# Patient Record
Sex: Female | Born: 1965 | Race: White | Hispanic: No | Smoking: Former smoker
Health system: Southern US, Community
[De-identification: ages and names within clinical notes are randomized; demographics above are authoritative.]

## PROBLEM LIST (undated history)

## (undated) DIAGNOSIS — Z8489 Family history of other specified conditions: Secondary | ICD-10-CM

## (undated) DIAGNOSIS — I10 Essential (primary) hypertension: Secondary | ICD-10-CM

## (undated) DIAGNOSIS — R112 Nausea with vomiting, unspecified: Secondary | ICD-10-CM

## (undated) DIAGNOSIS — K219 Gastro-esophageal reflux disease without esophagitis: Secondary | ICD-10-CM

## (undated) DIAGNOSIS — T8859XA Other complications of anesthesia, initial encounter: Secondary | ICD-10-CM

## (undated) DIAGNOSIS — T4145XA Adverse effect of unspecified anesthetic, initial encounter: Secondary | ICD-10-CM

## (undated) DIAGNOSIS — D72829 Elevated white blood cell count, unspecified: Secondary | ICD-10-CM

## (undated) DIAGNOSIS — Z9889 Other specified postprocedural states: Secondary | ICD-10-CM

## (undated) HISTORY — PX: ROTATOR CUFF REPAIR: SHX139

## (undated) HISTORY — PX: BREAST SURGERY: SHX581

---

## 2014-02-26 ENCOUNTER — Ambulatory Visit (HOSPITAL_COMMUNITY)
Admission: RE | Admit: 2014-02-26 | Discharge: 2014-02-26 | Disposition: A | Discharge status: Home / Self Care | Payer: BC Managed Care – PPO | Source: Ambulatory Visit | Attending: Orthopedic Surgery | Admitting: Orthopedic Surgery

## 2014-02-26 DIAGNOSIS — M25619 Stiffness of unspecified shoulder, not elsewhere classified: Secondary | ICD-10-CM | POA: Insufficient documentation

## 2014-02-26 DIAGNOSIS — M629 Disorder of muscle, unspecified: Secondary | ICD-10-CM | POA: Insufficient documentation

## 2014-02-26 DIAGNOSIS — M6281 Muscle weakness (generalized): Secondary | ICD-10-CM | POA: Insufficient documentation

## 2014-02-26 DIAGNOSIS — M25612 Stiffness of left shoulder, not elsewhere classified: Secondary | ICD-10-CM

## 2014-02-26 DIAGNOSIS — M6289 Other specified disorders of muscle: Secondary | ICD-10-CM

## 2014-02-26 DIAGNOSIS — IMO0001 Reserved for inherently not codable concepts without codable children: Secondary | ICD-10-CM | POA: Insufficient documentation

## 2014-02-26 DIAGNOSIS — M25519 Pain in unspecified shoulder: Secondary | ICD-10-CM | POA: Insufficient documentation

## 2014-02-26 NOTE — Evaluation (Signed)
Occupational Therapy Evaluation  Patient Details  Name: Jamie Flowers MRN: [id number] Date of Birth: 07/06/1966  Today's Date: 02/26/2014 Time: 4098-1191 OT Time Calculation (min): 35 min Eval 35'  Visit#: 1 of 16  Re-eval: 03/26/14  Assessment Diagnosis: Left Adhesive Capulitis Surgical Date:  (n/a) Prior Therapy: 6 weeks at [facility]  Authorization: Workers Comp  Authorization Time Period: n/a  Authorization Visit#:   of     Past Medical History: No past medical history on file. Past Surgical History: No past surgical history on file.  Subjective Symptoms/Limitations Symptoms: S: "I need to get back to full time work, its just taking so long." Pertinent History: This 48 yo pt is presenting to [facility] after n injury on December 3rd.  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  Pt had increased assist at work, but went onto leave on December 30th.  Pt was able to recieve appointment at [facility] on [facility] 12[facility], initating her own protocl of RICE, and sling use prior to apopintment. Pt initatiated 6 week of PT in [place], and is now transferring services to [facility] for ease of getting to appointments. MD previousl prescribed an anti-inflammatory, and pt verbalizes this does not help. Limitations: Increased pain with pushing, pulling, lifiting Patient Stated Goals: To get back to full time work, to get her left arm across her back. Pain Assessment Currently in Pain?: No/denies (if arm is hanging 4/10, if push/pull/lift 10/10)  Precautions/Restrictions  Precautions Precautions: None Restrictions Weight Bearing Restrictions: No  Balance Screening Balance Screen Has the patient fallen in the past 6 months: No  Prior Functioning  Home Living Family/patient expects to be discharged to:: Private residence Living Arrangements:  Children Available Help at Discharge: Family (son, 55 yo) Prior Function Level of Independence: Independent with basic ADLs;Independent with homemaking with ambulation Driving: Yes Vocation: Full time employment 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 she has increased pain with turning the steering wheel when driving.  She has difficulty geting comfortable to sleep and has increased pain upon waking up.  She has difficulty doing her hair, putting on her bra, and holding anything with her left hand Dominant Hand: Right Vision - History Baseline Vision:  (Will be getting bifocals)  Cognition/Observation Cognition Overall Cognitive Status: Within Functional Limits for tasks assessed Arousal/Alertness: Awake/alert Orientation Level: Oriented X4  Sensation/Coordination/Edema Sensation Light Touch: Appears Intact Additional Comments: Pt occassionally has hot tingling in left palm.  pt has sharp pain from acromion process region.  With motion of ER in shoulder abdcution, pt has falling asleep feeling in median nerve distrubution of hand. Coordination Gross Motor Movements are Fluid and Coordinated: Yes Fine Motor Movements are Fluid and Coordinated: Yes  Additional Assessments RUE Strength Grip (lbs): 77 Lateral Pinch: 16 lbs 3 Point Pinch: 19 lbs LUE AROM (degrees) LUE Overall AROM Comments: Assessed supine (and sitting), with ER/IR adducted Left Shoulder Flexion: 114 Degrees (92) Left Shoulder ABduction: 86 Degrees (80) Left Shoulder Internal Rotation: 68 Degrees (86) Left Shoulder External Rotation: 54 Degrees (52) LUE PROM (degrees) LUE Overall PROM Comments: Assess in supine,  with ER/IR adducted Left  Shoulder Flexion: 121 Degrees Left Shoulder ABduction: 86 Degrees Left Shoulder Internal Rotation: 85 Degrees Left Shoulder External Rotation: 38 Degrees LUE Strength LUE Overall Strength Comments: Assess in sitting Left Shoulder Flexion: 3-/5 Left Shoulder ABduction: 3-/5 Left Shoulder Internal Rotation: 3-/5 Left Shoulder External Rotation: 3-/5 Grip (lbs): 16 Lateral Pinch: 9 lbs 3 Point Pinch: 9 lbs Palpation Palpation: pt has max fascial restrictions in upper arm, anterior shoulder, upper trap, and scapular regions Right Hand Strength - Pinch (lbs) Lateral Pinch: 16 lbs 3 Point Pinch: 19 lbs Left Hand Strength - Pinch (lbs) Lateral Pinch: 9 lbs 3 Point Pinch: 9 lbs    Occupational Therapy Assessment and Plan OT Assessment and Plan Clinical Impression Statement: Pt is presenting for OT evaluation with dx of adhesive capsulitis of left shoulder.  Pt has increased pain, decreased PROM and AROM, increased fascial restrictions, decreased strength, and decreased LUE functional use. Pt will benefit from skilled therapeutic intervention in order to improve on the following deficits: Impaired UE functional use;Increased fascial restricitons;Decreased range of motion;Decreased activity tolerance;Pain;Decreased strength Rehab Potential: Good OT Frequency: Min 2X/week OT Duration: 8 weeks OT Treatment/Interventions: Self-care/ADL training;Therapeutic activities;Therapeutic exercise;Neuromuscular education;Patient/family education;Manual therapy;Modalities OT Plan: Pt will benefit from skilled OT services to decrease pain, increase AROM, incrase strength, decrease fascial restictions, andn imporve overall LUE functional use.    Treatment Plan: MFR and manual stretching, PROM, AAROM, AROM, proximal strengthening, scapular stabilization, e-stim/modalities PRN, HEP, general sterngthening.  Next session - review current dowel rod HEP and complete FOTO.   Goals Home  Exercise Program Pt/caregiver will Perform Home Exercise Program: For increased ROM;For increased strengthening PT Goal: Perform Home Exercise Program - Progress: Goal set today Short Term Goals Time to Complete Short Term Goals: 4 weeks Short Term Goal 1: Pt will be educated on HEP Short Term Goal 2: Pt will improve left shoulder [facility] working towards being able to play pool Short Term Goal 3: Pt will improve left shoulder strength to atleast 3+/5 to impove her ability to walk her dogs. Short Term Goal 4: Pt will imporve left grip strength by atleast 15 lbs in order to imporve ability to walk her dogs. Short Term Goal 5: Pt will decreased left fascial restrictions to moderate to imporve her pain levels. Additional Short Term Goals?: Yes Short Term Goal 6: Pt will verbalize pain of less than 7/10 when leaning on her left arm. Long Term Goals Time to Complete Long Term Goals: 8 weeks Long Term Goal 1: Pt will achieve prior level of functioning in all ADL, IADL, work, and leisure activities. Long Term Goal 2: Pt will achieve AROM WFL in order to put on her bra. Long Term Goal 3: Pt will improve left shoulder strength to atleast 4+/5 in order to manage dogs at work. Long Term Goal 4: Pt will improve grip strength to atleast 50lbs in order to walk her dogs. Long Term Goal 5: Pt will have decreased fascial restrictions in her left shoulder to minimal, for decreased pain in AROM. Additional Long Term Goals?: Yes Long Term Goal 6: Pt will verbalize being able to complete one 20-minute routine of P90X with less than 3/10 pain in her left shoulder.  Problem List Patient Active Problem List   Diagnosis Date Noted  . Pain in joint, shoulder region 02/26/2014  . Decreased range of motion of left shoulder 02/26/2014  . Tight fascia 02/26/2014  . Muscle weakness (generalized) 02/26/2014    End of Session Activity Tolerance: Patient tolerated  treatment well General Behavior During  Therapy: WFL for tasks assessed/performed OT Plan of Care OT Home Exercise Plan: Discussed current HEP with pt - continuation of wall walks in flexion and abduction and dowel rod exercises.  GO   Jamie GuanMarie Rawlings, MS, OTR/L [phone]  02/26/2014, 5:35 PM  Physician Documentation Your signature is required to indicate approval of the treatment plan as stated above.  Please sign and either send electronically or make a copy of this report for your files and return this physician signed original.  Please mark one 1.__approve of plan  2. ___approve of plan with the following conditions.   ______________________________                                                          _____________________ Physician Signature                                                                                                             Date

## 2014-02-27 ENCOUNTER — Ambulatory Visit (HOSPITAL_COMMUNITY): Payer: Self-pay

## 2014-03-07 ENCOUNTER — Ambulatory Visit (HOSPITAL_COMMUNITY): Payer: Self-pay

## 2014-03-07 ENCOUNTER — Telehealth (HOSPITAL_COMMUNITY): Payer: Self-pay

## 2014-03-08 ENCOUNTER — Ambulatory Visit (HOSPITAL_COMMUNITY): Payer: Self-pay

## 2014-03-12 ENCOUNTER — Ambulatory Visit (HOSPITAL_COMMUNITY): Payer: Self-pay

## 2014-03-14 ENCOUNTER — Ambulatory Visit (HOSPITAL_COMMUNITY): Payer: Self-pay

## 2014-03-19 ENCOUNTER — Ambulatory Visit (HOSPITAL_COMMUNITY): Payer: Self-pay

## 2014-03-21 ENCOUNTER — Ambulatory Visit (HOSPITAL_COMMUNITY): Payer: Self-pay

## 2014-04-02 NOTE — Addendum Note (Signed)
Encounter addended by: Jeanene Erb, OT on: 04/02/2014 10:50 AM<BR>     Documentation filed: Episodes, Notes Section, Letters, Follow-up Section

## 2014-04-02 NOTE — Progress Notes (Signed)
Discharge Note.   Patient Details  Name: Jamie Flowers MRN: [id number] Date of Birth: 11-10-1965  Today's Date: 04/02/2014 The above patient was discharged from OT on 04/02/2014 secondary to patient was not approved by insurance to attend therapy.  Limmie Patricia, OTR/L,CBIS   04/02/2014, 10:49 AM

## 2014-04-04 ENCOUNTER — Other Ambulatory Visit (HOSPITAL_COMMUNITY): Payer: Self-pay | Admitting: Family Medicine

## 2014-04-04 DIAGNOSIS — Z139 Encounter for screening, unspecified: Secondary | ICD-10-CM

## 2014-04-15 ENCOUNTER — Ambulatory Visit (HOSPITAL_COMMUNITY): Payer: Self-pay

## 2014-04-18 ENCOUNTER — Ambulatory Visit (HOSPITAL_COMMUNITY): Payer: Self-pay

## 2014-05-09 ENCOUNTER — Ambulatory Visit (HOSPITAL_COMMUNITY)
Admission: RE | Admit: 2014-05-09 | Discharge: 2014-05-09 | Disposition: A | Discharge status: Home / Self Care | Payer: BC Managed Care – PPO | Source: Ambulatory Visit | Attending: Family Medicine | Admitting: Family Medicine

## 2014-05-09 ENCOUNTER — Other Ambulatory Visit (HOSPITAL_COMMUNITY): Payer: Self-pay | Admitting: Family Medicine

## 2014-05-09 DIAGNOSIS — R05 Cough: Secondary | ICD-10-CM

## 2014-05-09 DIAGNOSIS — R059 Cough, unspecified: Secondary | ICD-10-CM | POA: Insufficient documentation

## 2014-05-09 DIAGNOSIS — R0602 Shortness of breath: Secondary | ICD-10-CM | POA: Insufficient documentation

## 2014-05-13 ENCOUNTER — Ambulatory Visit (HOSPITAL_COMMUNITY): Payer: BC Managed Care – PPO

## 2014-05-22 ENCOUNTER — Other Ambulatory Visit (HOSPITAL_COMMUNITY): Payer: Self-pay | Admitting: Oncology

## 2014-05-22 DIAGNOSIS — F17200 Nicotine dependence, unspecified, uncomplicated: Secondary | ICD-10-CM

## 2014-05-22 DIAGNOSIS — D72829 Elevated white blood cell count, unspecified: Secondary | ICD-10-CM

## 2014-06-05 ENCOUNTER — Ambulatory Visit (HOSPITAL_COMMUNITY): Payer: BC Managed Care – PPO

## 2014-06-10 ENCOUNTER — Encounter (HOSPITAL_COMMUNITY): Payer: Self-pay | Admitting: Emergency Medicine

## 2014-06-10 ENCOUNTER — Other Ambulatory Visit (HOSPITAL_COMMUNITY): Payer: Self-pay | Admitting: Emergency Medicine

## 2014-06-10 ENCOUNTER — Emergency Department (HOSPITAL_COMMUNITY)
Admission: EM | Admit: 2014-06-10 | Discharge: 2014-06-10 | Disposition: A | Discharge status: Home / Self Care | Payer: BC Managed Care – PPO | Attending: Emergency Medicine | Admitting: Emergency Medicine

## 2014-06-10 ENCOUNTER — Ambulatory Visit (HOSPITAL_COMMUNITY)
Admit: 2014-06-10 | Discharge: 2014-06-10 | Disposition: A | Discharge status: Home / Self Care | Payer: BC Managed Care – PPO | Source: Ambulatory Visit | Attending: Emergency Medicine | Admitting: Emergency Medicine

## 2014-06-10 DIAGNOSIS — R110 Nausea: Secondary | ICD-10-CM | POA: Insufficient documentation

## 2014-06-10 DIAGNOSIS — Z79899 Other long term (current) drug therapy: Secondary | ICD-10-CM | POA: Insufficient documentation

## 2014-06-10 DIAGNOSIS — Z3202 Encounter for pregnancy test, result negative: Secondary | ICD-10-CM | POA: Insufficient documentation

## 2014-06-10 DIAGNOSIS — F17200 Nicotine dependence, unspecified, uncomplicated: Secondary | ICD-10-CM | POA: Insufficient documentation

## 2014-06-10 DIAGNOSIS — R1011 Right upper quadrant pain: Secondary | ICD-10-CM

## 2014-06-10 DIAGNOSIS — I10 Essential (primary) hypertension: Secondary | ICD-10-CM | POA: Insufficient documentation

## 2014-06-10 HISTORY — DX: Essential (primary) hypertension: I10

## 2014-06-10 LAB — URINALYSIS, ROUTINE W REFLEX MICROSCOPIC
Bilirubin Urine: NEGATIVE
Glucose, UA: NEGATIVE mg/dL
Ketones, ur: NEGATIVE mg/dL
Nitrite: NEGATIVE
PROTEIN: NEGATIVE mg/dL
Specific Gravity, Urine: 1.015 (ref 1.005–1.030)
UROBILINOGEN UA: 0.2 mg/dL (ref 0.0–1.0)
pH: 7 (ref 5.0–8.0)

## 2014-06-10 LAB — COMPREHENSIVE METABOLIC PANEL
ALK PHOS: 85 U/L (ref 39–117)
ALT: 20 U/L (ref 0–35)
ANION GAP: 9 (ref 5–15)
AST: 14 U/L (ref 0–37)
Albumin: 3.4 g/dL — ABNORMAL LOW (ref 3.5–5.2)
BILIRUBIN TOTAL: 0.2 mg/dL — AB (ref 0.3–1.2)
BUN: 14 mg/dL (ref 6–23)
CHLORIDE: 102 meq/L (ref 96–112)
CO2: 25 mEq/L (ref 19–32)
Calcium: 9 mg/dL (ref 8.4–10.5)
Creatinine, Ser: 0.81 mg/dL (ref 0.50–1.10)
GFR calc non Af Amer: 86 mL/min — ABNORMAL LOW (ref >90)
GLUCOSE: 125 mg/dL — AB (ref 70–99)
POTASSIUM: 3.8 meq/L (ref 3.7–5.3)
Sodium: 136 mEq/L — ABNORMAL LOW (ref 137–147)
Total Protein: 7 g/dL (ref 6.0–8.3)

## 2014-06-10 LAB — CBC WITH DIFFERENTIAL/PLATELET
BASOS PCT: 0 % (ref 0–1)
Basophils Absolute: 0.1 10*3/uL (ref 0.0–0.1)
Eosinophils Absolute: 0.3 10*3/uL (ref 0.0–0.7)
Eosinophils Relative: 2 % (ref 0–5)
HCT: 42.8 % (ref 36.0–46.0)
Hemoglobin: 14.9 g/dL (ref 12.0–15.0)
Lymphocytes Relative: 32 % (ref 12–46)
Lymphs Abs: 4.3 10*3/uL — ABNORMAL HIGH (ref 0.7–4.0)
MCH: 30.5 pg (ref 26.0–34.0)
MCHC: 34.8 g/dL (ref 30.0–36.0)
MCV: 87.5 fL (ref 78.0–100.0)
MONO ABS: 0.9 10*3/uL (ref 0.1–1.0)
Monocytes Relative: 7 % (ref 3–12)
NEUTROS ABS: 7.9 10*3/uL — AB (ref 1.7–7.7)
NEUTROS PCT: 59 % (ref 43–77)
Platelets: 304 10*3/uL (ref 150–400)
RBC: 4.89 MIL/uL (ref 3.87–5.11)
RDW: 13.8 % (ref 11.5–15.5)
WBC: 13.5 10*3/uL — ABNORMAL HIGH (ref 4.0–10.5)

## 2014-06-10 LAB — LIPASE, BLOOD: Lipase: 42 U/L (ref 11–59)

## 2014-06-10 LAB — I-STAT CG4 LACTIC ACID, ED: Lactic Acid, Venous: 1.36 mmol/L (ref 0.5–2.2)

## 2014-06-10 LAB — URINE MICROSCOPIC-ADD ON

## 2014-06-10 LAB — POC URINE PREG, ED: PREG TEST UR: NEGATIVE

## 2014-06-10 MED ORDER — MORPHINE SULFATE 4 MG/ML IJ SOLN
4.0000 mg | Freq: Once | INTRAMUSCULAR | Status: AC
  Administered 2014-06-10: 4 mg via INTRAVENOUS
  Filled 2014-06-10: qty 1

## 2014-06-10 MED ORDER — ONDANSETRON HCL 4 MG/2ML IJ SOLN
4.0000 mg | Freq: Once | INTRAMUSCULAR | Status: AC
  Administered 2014-06-10: 4 mg via INTRAVENOUS
  Filled 2014-06-10: qty 2

## 2014-06-10 MED ORDER — PANTOPRAZOLE SODIUM 40 MG IV SOLR
40.0000 mg | Freq: Once | INTRAVENOUS | Status: AC
  Administered 2014-06-10: 40 mg via INTRAVENOUS
  Filled 2014-06-10: qty 40

## 2014-06-10 MED ORDER — OXYCODONE-ACETAMINOPHEN 5-325 MG PO TABS
2.0000 | ORAL_TABLET | Freq: Once | ORAL | Status: AC
  Administered 2014-06-10: 2 via ORAL
  Filled 2014-06-10: qty 2

## 2014-06-10 MED ORDER — HYDROMORPHONE HCL PF 1 MG/ML IJ SOLN
1.0000 mg | Freq: Once | INTRAMUSCULAR | Status: AC
  Administered 2014-06-10: 1 mg via INTRAVENOUS
  Filled 2014-06-10: qty 1

## 2014-06-10 NOTE — Discharge Instructions (Signed)

## 2014-06-10 NOTE — ED Notes (Signed)
Patient complaining of upper right abdominal pain x 2 days. Also complaining of nausea and abdominal bloating.

## 2014-06-10 NOTE — ED Notes (Signed)
Discharge instructions given and reviewed with patient.  Patient verbalized understanding to return at 10am for outpatient ultrasound.  Patient ambulatory; discharged home in good condition.

## 2014-06-10 NOTE — ED Provider Notes (Signed)
CSN: [id number]     Arrival date & time 06/10/14  0148 History   First MD Initiated Contact with Patient 06/10/14 0201     Chief Complaint  Patient presents with  . Abdominal Pain      Patient is a 48 y.o. female presenting with abdominal pain. The history is provided by the patient.  Abdominal Pain Pain location:  RUQ Pain quality: burning   Pain radiates to:  Does not radiate Pain severity:  Severe Onset quality:  Sudden Duration:  24 hours Timing:  Constant Progression:  Worsening Chronicity:  New Relieved by:  Nothing Worsened by:  Palpation Associated symptoms: nausea   Associated symptoms: no chest pain, no constipation, no diarrhea, no dysuria, no fever, no hematemesis, no hematochezia, no shortness of breath, no vaginal bleeding, no vaginal discharge and no vomiting   PT reports over 24 hrs of RUQ pain She has never had this before It started at rest No lower abd tenderness   She reports recent oncology workup due to elevated WBC count.  She is scheduled to have CT imaging of her body but this has not been performed as of yet  Past Medical History  Diagnosis Date  . Hypertension    Past Surgical History  Procedure Laterality Date  . Rotator cuff repair     History reviewed. No pertinent family history. History  Substance Use Topics  . Smoking status: Current Every Day Smoker  . Smokeless tobacco: Not on file  . Alcohol Use: No   OB History   Grav Para Term Preterm Abortions TAB SAB Ect Mult Living                 Review of Systems  Constitutional: Negative for fever.  Respiratory: Negative for shortness of breath.   Cardiovascular: Negative for chest pain.  Gastrointestinal: Positive for nausea and abdominal pain. Negative for vomiting, diarrhea, constipation, hematochezia and hematemesis.  Genitourinary: Negative for dysuria, vaginal bleeding and vaginal discharge.  All other systems reviewed and are negative.     Allergies  Rubbing  alcohol  Home Medications   Prior to Admission medications   Medication Sig Start Date End Date Taking? Authorizing Provider  lisinopril (PRINIVIL,ZESTRIL) 20 MG tablet Take 10 mg by mouth daily.   Yes Historical Provider, MD   BP 128/72  Pulse 85  Temp(Src) 98.1 F (36.7 C) (Oral)  Resp 16  Ht 5\' 2"  (1.575 m)  Wt 160 lb (72.576 kg)  BMI 29.26 kg/m2  SpO2 100%  LMP 05/14/2014 Physical Exam CONSTITUTIONAL: Well developed/well nourished HEAD: Normocephalic/atraumatic EYES: EOMI/PERRL, no icterus ENMT: Mucous membranes moist NECK: supple no meningeal signs SPINE:entire spine nontender CV: S1/S2 noted, no murmurs/rubs/gallops noted LUNGS: Lungs are clear to auscultation bilaterally, no apparent distress ABDOMEN: soft, moderate RUQ tenderness, no rebound or guarding GU:no cva tenderness NEURO: Pt is awake/alert, moves all extremitiesx4 EXTREMITIES: pulses normal, full ROM SKIN: warm, color normal PSYCH: no abnormalities of mood noted  ED Course  Procedures  2:20 AM Pt with RUQ tenderness for 24 hours Will treat pain, check labs and reasses 4:41 AM Pt improved She reports continued abd burning, but severe pain is resolved Her abdomen is soft without any other focal tenderness in RLQ She has no signs of surgical abdomen She is not toxic appearing She will return for close f/u ultrasound due to RUQ pain to evaluate for biliary colic Pt is reasonable and appears appropriate for outpatient followup We discussed strict return precautions BP 103/71  Pulse  61  Temp(Src) 98.1 F (36.7 C) (Oral)  Resp 12  Ht 5\' 2"  (1.575 m)  Wt 160 lb (72.576 kg)  BMI 29.26 kg/m2  SpO2 100%  LMP 05/14/2014  Labs Review Labs Reviewed  COMPREHENSIVE METABOLIC PANEL - Abnormal; Notable for the following:    Sodium 136 (*)    Glucose, Bld 125 (*)    Albumin 3.4 (*)    Total Bilirubin 0.2 (*)    GFR calc non Af Amer 86 (*)    All other components within normal limits  CBC WITH  DIFFERENTIAL - Abnormal; Notable for the following:    WBC 13.5 (*)    Neutro Abs 7.9 (*)    Lymphs Abs 4.3 (*)    All other components within normal limits  URINALYSIS, ROUTINE W REFLEX MICROSCOPIC - Abnormal; Notable for the following:    Hgb urine dipstick TRACE (*)    Leukocytes, UA TRACE (*)    All other components within normal limits  URINE MICROSCOPIC-ADD ON - Abnormal; Notable for the following:    Squamous Epithelial / LPF FEW (*)    Bacteria, UA FEW (*)    All other components within normal limits  LIPASE, BLOOD  POC URINE PREG, ED  I-STAT CG4 LACTIC ACID, ED      EKG Interpretation   Date/Time:  Monday June 10 2014 04:01:03 EDT Ventricular Rate:  62 PR Interval:  150 QRS Duration: 84 QT Interval:  432 QTC Calculation: 439 R Axis:   59 Text Interpretation:  Sinus rhythm Normal ECG No previous ECGs available  Confirmed by Bebe ShaggyWICKLINE  MD, Dorinda HillNALD ([id number]) on 06/10/2014 4:13:15 AM      MDM   Final diagnoses:  RUQ abdominal pain    Nursing notes including past medical history and social history reviewed and considered in documentation Labs/vital reviewed and considered     Joya Gaskinsonald W Lache Dagher, MD 06/10/14 [phone]

## 2014-06-13 ENCOUNTER — Other Ambulatory Visit (HOSPITAL_COMMUNITY): Payer: Self-pay | Admitting: Family Medicine

## 2014-06-13 ENCOUNTER — Ambulatory Visit (HOSPITAL_COMMUNITY)
Admission: RE | Admit: 2014-06-13 | Discharge: 2014-06-13 | Disposition: A | Discharge status: Home / Self Care | Payer: BC Managed Care – PPO | Source: Ambulatory Visit | Attending: Family Medicine | Admitting: Family Medicine

## 2014-06-13 DIAGNOSIS — J438 Other emphysema: Secondary | ICD-10-CM | POA: Insufficient documentation

## 2014-06-13 DIAGNOSIS — K8020 Calculus of gallbladder without cholecystitis without obstruction: Secondary | ICD-10-CM | POA: Insufficient documentation

## 2014-06-13 DIAGNOSIS — R110 Nausea: Secondary | ICD-10-CM | POA: Insufficient documentation

## 2014-06-13 DIAGNOSIS — R1013 Epigastric pain: Secondary | ICD-10-CM | POA: Insufficient documentation

## 2014-06-13 DIAGNOSIS — R109 Unspecified abdominal pain: Secondary | ICD-10-CM | POA: Insufficient documentation

## 2014-06-13 DIAGNOSIS — R042 Hemoptysis: Secondary | ICD-10-CM

## 2014-06-13 MED ORDER — IOHEXOL 300 MG/ML  SOLN
100.0000 mL | Freq: Once | INTRAMUSCULAR | Status: AC | PRN
  Administered 2014-06-13: 100 mL via INTRAVENOUS

## 2014-06-14 NOTE — ED Provider Notes (Signed)
Pt called in followup to give US results She was informed of cholelithiasis and need for surgical followup She was given number for local surgeon (mark jenkins) to followup  Jamie Flowers W Uilani Sanville, MD 06/14/14 1004

## 2014-06-15 ENCOUNTER — Encounter (HOSPITAL_COMMUNITY): Payer: Self-pay | Admitting: Emergency Medicine

## 2014-06-15 ENCOUNTER — Observation Stay (HOSPITAL_COMMUNITY)
Admission: EM | Admit: 2014-06-15 | Discharge: 2014-06-17 | Disposition: A | Discharge status: Home / Self Care | Payer: BC Managed Care – PPO | Attending: General Surgery | Admitting: General Surgery

## 2014-06-15 DIAGNOSIS — Z9109 Other allergy status, other than to drugs and biological substances: Secondary | ICD-10-CM | POA: Insufficient documentation

## 2014-06-15 DIAGNOSIS — R1011 Right upper quadrant pain: Secondary | ICD-10-CM | POA: Diagnosis present

## 2014-06-15 DIAGNOSIS — K8010 Calculus of gallbladder with chronic cholecystitis without obstruction: Principal | ICD-10-CM | POA: Insufficient documentation

## 2014-06-15 DIAGNOSIS — I10 Essential (primary) hypertension: Secondary | ICD-10-CM | POA: Diagnosis not present

## 2014-06-15 DIAGNOSIS — K660 Peritoneal adhesions (postprocedural) (postinfection): Secondary | ICD-10-CM | POA: Insufficient documentation

## 2014-06-15 DIAGNOSIS — K819 Cholecystitis, unspecified: Secondary | ICD-10-CM | POA: Diagnosis present

## 2014-06-15 HISTORY — DX: Other complications of anesthesia, initial encounter: T88.59XA

## 2014-06-15 HISTORY — DX: Family history of other specified conditions: Z84.89

## 2014-06-15 HISTORY — DX: Adverse effect of unspecified anesthetic, initial encounter: T41.45XA

## 2014-06-15 HISTORY — DX: Nausea with vomiting, unspecified: R11.2

## 2014-06-15 HISTORY — DX: Other specified postprocedural states: Z98.890

## 2014-06-15 MED ORDER — ONDANSETRON HCL 4 MG/2ML IJ SOLN
4.0000 mg | Freq: Once | INTRAMUSCULAR | Status: AC
  Administered 2014-06-15: 4 mg via INTRAVENOUS
  Filled 2014-06-15: qty 2

## 2014-06-15 MED ORDER — HYDROMORPHONE HCL PF 1 MG/ML IJ SOLN
1.0000 mg | Freq: Once | INTRAMUSCULAR | Status: AC
  Administered 2014-06-15: 1 mg via INTRAVENOUS
  Filled 2014-06-15: qty 1

## 2014-06-15 NOTE — ED Notes (Signed)
Pt states she was seen here last week and told she had gallstones, states tonight the pain got worse and is intolerable, vomiting, no diarrhea

## 2014-06-16 ENCOUNTER — Emergency Department (HOSPITAL_COMMUNITY): Payer: BC Managed Care – PPO

## 2014-06-16 ENCOUNTER — Encounter (HOSPITAL_COMMUNITY): Payer: Self-pay | Admitting: Certified Registered"

## 2014-06-16 ENCOUNTER — Encounter (HOSPITAL_COMMUNITY): Payer: BC Managed Care – PPO | Admitting: Anesthesiology

## 2014-06-16 ENCOUNTER — Observation Stay (HOSPITAL_COMMUNITY): Payer: BC Managed Care – PPO | Admitting: Anesthesiology

## 2014-06-16 ENCOUNTER — Encounter (HOSPITAL_COMMUNITY)
Admission: EM | Disposition: A | Discharge status: Home / Self Care | Payer: Self-pay | Source: Home / Self Care | Attending: Emergency Medicine

## 2014-06-16 DIAGNOSIS — R1011 Right upper quadrant pain: Secondary | ICD-10-CM

## 2014-06-16 DIAGNOSIS — K819 Cholecystitis, unspecified: Secondary | ICD-10-CM | POA: Diagnosis present

## 2014-06-16 DIAGNOSIS — K8010 Calculus of gallbladder with chronic cholecystitis without obstruction: Secondary | ICD-10-CM

## 2014-06-16 DIAGNOSIS — I10 Essential (primary) hypertension: Secondary | ICD-10-CM | POA: Diagnosis not present

## 2014-06-16 DIAGNOSIS — Z9109 Other allergy status, other than to drugs and biological substances: Secondary | ICD-10-CM | POA: Diagnosis not present

## 2014-06-16 HISTORY — PX: CHOLECYSTECTOMY: SHX55

## 2014-06-16 LAB — COMPREHENSIVE METABOLIC PANEL
ALK PHOS: 88 U/L (ref 39–117)
ALT: 18 U/L (ref 0–35)
AST: 16 U/L (ref 0–37)
Albumin: 3.8 g/dL (ref 3.5–5.2)
Anion gap: 15 (ref 5–15)
BILIRUBIN TOTAL: 0.2 mg/dL — AB (ref 0.3–1.2)
BUN: 17 mg/dL (ref 6–23)
CHLORIDE: 100 meq/L (ref 96–112)
CO2: 20 meq/L (ref 19–32)
Calcium: 10.5 mg/dL (ref 8.4–10.5)
Creatinine, Ser: 0.93 mg/dL (ref 0.50–1.10)
GFR, EST AFRICAN AMERICAN: 84 mL/min — AB (ref >90)
GFR, EST NON AFRICAN AMERICAN: 73 mL/min — AB (ref >90)
GLUCOSE: 96 mg/dL (ref 70–99)
POTASSIUM: 4.1 meq/L (ref 3.7–5.3)
Sodium: 135 mEq/L — ABNORMAL LOW (ref 137–147)
Total Protein: 7.6 g/dL (ref 6.0–8.3)

## 2014-06-16 LAB — CBC WITH DIFFERENTIAL/PLATELET
Basophils Absolute: 0.1 10*3/uL (ref 0.0–0.1)
Basophils Relative: 1 % (ref 0–1)
EOS ABS: 0.2 10*3/uL (ref 0.0–0.7)
Eosinophils Relative: 2 % (ref 0–5)
HCT: 45 % (ref 36.0–46.0)
HEMOGLOBIN: 15.8 g/dL — AB (ref 12.0–15.0)
Lymphocytes Relative: 23 % (ref 12–46)
Lymphs Abs: 3.5 10*3/uL (ref 0.7–4.0)
MCH: 30.3 pg (ref 26.0–34.0)
MCHC: 35.1 g/dL (ref 30.0–36.0)
MCV: 86.2 fL (ref 78.0–100.0)
MONOS PCT: 6 % (ref 3–12)
Monocytes Absolute: 1 10*3/uL (ref 0.1–1.0)
NEUTROS ABS: 10.4 10*3/uL — AB (ref 1.7–7.7)
Neutrophils Relative %: 68 % (ref 43–77)
Platelets: 373 10*3/uL (ref 150–400)
RBC: 5.22 MIL/uL — ABNORMAL HIGH (ref 3.87–5.11)
RDW: 13.8 % (ref 11.5–15.5)
WBC: 15.2 10*3/uL — AB (ref 4.0–10.5)

## 2014-06-16 LAB — SURGICAL PCR SCREEN
MRSA, PCR: NEGATIVE
STAPHYLOCOCCUS AUREUS: NEGATIVE

## 2014-06-16 LAB — LIPASE, BLOOD: Lipase: 32 U/L (ref 11–59)

## 2014-06-16 SURGERY — LAPAROSCOPIC CHOLECYSTECTOMY WITH INTRAOPERATIVE CHOLANGIOGRAM
Anesthesia: General | Site: Abdomen

## 2014-06-16 MED ORDER — LIDOCAINE HCL (CARDIAC) 20 MG/ML IV SOLN
INTRAVENOUS | Status: AC
  Filled 2014-06-16: qty 5

## 2014-06-16 MED ORDER — PROPOFOL 10 MG/ML IV BOLUS
INTRAVENOUS | Status: AC
  Filled 2014-06-16: qty 20

## 2014-06-16 MED ORDER — HYDROMORPHONE HCL PF 1 MG/ML IJ SOLN
1.0000 mg | Freq: Once | INTRAMUSCULAR | Status: AC
  Administered 2014-06-16: 1 mg via INTRAVENOUS
  Filled 2014-06-16: qty 1

## 2014-06-16 MED ORDER — GLYCOPYRROLATE 0.2 MG/ML IJ SOLN
INTRAMUSCULAR | Status: DC | PRN
  Administered 2014-06-16: .6 mg via INTRAVENOUS

## 2014-06-16 MED ORDER — AMPHETAMINE-DEXTROAMPHETAMINE 10 MG PO TABS
20.0000 mg | ORAL_TABLET | Freq: Every day | ORAL | Status: DC
  Filled 2014-06-16: qty 2

## 2014-06-16 MED ORDER — GLYCOPYRROLATE 0.2 MG/ML IJ SOLN
INTRAMUSCULAR | Status: AC
  Filled 2014-06-16: qty 3

## 2014-06-16 MED ORDER — ROCURONIUM BROMIDE 100 MG/10ML IV SOLN
INTRAVENOUS | Status: DC | PRN
  Administered 2014-06-16: 40 mg via INTRAVENOUS

## 2014-06-16 MED ORDER — LACTATED RINGERS IV SOLN
INTRAVENOUS | Status: DC | PRN
  Administered 2014-06-16 (×2): via INTRAVENOUS

## 2014-06-16 MED ORDER — NEOSTIGMINE METHYLSULFATE 10 MG/10ML IV SOLN
INTRAVENOUS | Status: DC | PRN
  Administered 2014-06-16: 4 mg via INTRAVENOUS

## 2014-06-16 MED ORDER — HYDROMORPHONE HCL PF 1 MG/ML IJ SOLN
INTRAMUSCULAR | Status: AC
  Filled 2014-06-16: qty 2

## 2014-06-16 MED ORDER — EPHEDRINE SULFATE 50 MG/ML IJ SOLN
INTRAMUSCULAR | Status: DC | PRN
  Administered 2014-06-16: 10 mg via INTRAVENOUS

## 2014-06-16 MED ORDER — DEXTROSE 5 % IV SOLN
2.0000 g | Freq: Every day | INTRAVENOUS | Status: DC
  Administered 2014-06-16 – 2014-06-17 (×2): 2 g via INTRAVENOUS
  Filled 2014-06-16 (×2): qty 2

## 2014-06-16 MED ORDER — HYDROMORPHONE HCL PF 1 MG/ML IJ SOLN
1.0000 mg | INTRAMUSCULAR | Status: DC | PRN
  Administered 2014-06-16 – 2014-06-17 (×3): 1 mg via INTRAVENOUS
  Filled 2014-06-16 (×3): qty 1

## 2014-06-16 MED ORDER — KETOROLAC TROMETHAMINE 30 MG/ML IJ SOLN
INTRAMUSCULAR | Status: AC
Start: 2014-06-16 — End: 2014-06-17
  Filled 2014-06-16: qty 1

## 2014-06-16 MED ORDER — BUPIVACAINE-EPINEPHRINE (PF) 0.25% -1:200000 IJ SOLN
INTRAMUSCULAR | Status: AC
  Filled 2014-06-16: qty 30

## 2014-06-16 MED ORDER — 0.9 % SODIUM CHLORIDE (POUR BTL) OPTIME
TOPICAL | Status: DC | PRN
  Administered 2014-06-16: 1000 mL

## 2014-06-16 MED ORDER — ACETAMINOPHEN 325 MG PO TABS: 325.0000 mg | ORAL_TABLET | ORAL | Status: DC | PRN

## 2014-06-16 MED ORDER — FENTANYL CITRATE 0.05 MG/ML IJ SOLN
50.0000 ug | INTRAMUSCULAR | Status: DC | PRN
  Administered 2014-06-16 (×3): 50 ug via INTRAVENOUS
  Filled 2014-06-16 (×3): qty 2

## 2014-06-16 MED ORDER — MIDAZOLAM HCL 2 MG/2ML IJ SOLN
INTRAMUSCULAR | Status: AC
  Filled 2014-06-16: qty 2

## 2014-06-16 MED ORDER — ARTIFICIAL TEARS OP OINT
TOPICAL_OINTMENT | OPHTHALMIC | Status: AC
  Filled 2014-06-16: qty 3.5

## 2014-06-16 MED ORDER — PROPOFOL 10 MG/ML IV BOLUS
INTRAVENOUS | Status: DC | PRN
  Administered 2014-06-16: 120 mg via INTRAVENOUS

## 2014-06-16 MED ORDER — ONDANSETRON HCL 4 MG/2ML IJ SOLN
INTRAMUSCULAR | Status: AC
  Filled 2014-06-16: qty 2

## 2014-06-16 MED ORDER — SUCCINYLCHOLINE CHLORIDE 20 MG/ML IJ SOLN
INTRAMUSCULAR | Status: DC | PRN
  Administered 2014-06-16: 60 mg via INTRAVENOUS

## 2014-06-16 MED ORDER — HYDROMORPHONE HCL PF 1 MG/ML IJ SOLN
0.2500 mg | INTRAMUSCULAR | Status: DC | PRN
  Administered 2014-06-16 (×4): 0.5 mg via INTRAVENOUS

## 2014-06-16 MED ORDER — ACETAMINOPHEN 160 MG/5ML PO SOLN
325.0000 mg | ORAL | Status: DC | PRN
  Filled 2014-06-16: qty 20.3

## 2014-06-16 MED ORDER — FENTANYL CITRATE 0.05 MG/ML IJ SOLN
INTRAMUSCULAR | Status: DC | PRN
  Administered 2014-06-16: 100 ug via INTRAVENOUS
  Administered 2014-06-16: 50 ug via INTRAVENOUS

## 2014-06-16 MED ORDER — SODIUM CHLORIDE 0.9 % IR SOLN
Status: DC | PRN
  Administered 2014-06-16: 1000 mL

## 2014-06-16 MED ORDER — SUCCINYLCHOLINE CHLORIDE 20 MG/ML IJ SOLN
INTRAMUSCULAR | Status: AC
  Filled 2014-06-16: qty 1

## 2014-06-16 MED ORDER — LIDOCAINE HCL (CARDIAC) 20 MG/ML IV SOLN
INTRAVENOUS | Status: DC | PRN
  Administered 2014-06-16: 70 mg via INTRAVENOUS

## 2014-06-16 MED ORDER — HYDROMORPHONE HCL PF 1 MG/ML IJ SOLN
1.0000 mg | INTRAMUSCULAR | Status: DC | PRN
  Administered 2014-06-16 (×2): 1 mg via INTRAVENOUS
  Filled 2014-06-16 (×2): qty 1

## 2014-06-16 MED ORDER — ENOXAPARIN SODIUM 40 MG/0.4ML ~~LOC~~ SOLN
40.0000 mg | SUBCUTANEOUS | Status: DC
  Administered 2014-06-17: 40 mg via SUBCUTANEOUS
  Filled 2014-06-16 (×2): qty 0.4

## 2014-06-16 MED ORDER — MIDAZOLAM HCL 5 MG/5ML IJ SOLN
INTRAMUSCULAR | Status: DC | PRN
  Administered 2014-06-16: 2 mg via INTRAVENOUS

## 2014-06-16 MED ORDER — SODIUM CHLORIDE 0.9 % IV SOLN
INTRAVENOUS | Status: DC
  Administered 2014-06-16 – 2014-06-17 (×3): via INTRAVENOUS

## 2014-06-16 MED ORDER — ROCURONIUM BROMIDE 50 MG/5ML IV SOLN
INTRAVENOUS | Status: AC
  Filled 2014-06-16: qty 1

## 2014-06-16 MED ORDER — FENTANYL CITRATE 0.05 MG/ML IJ SOLN
INTRAMUSCULAR | Status: AC
  Filled 2014-06-16: qty 5

## 2014-06-16 MED ORDER — BUPIVACAINE-EPINEPHRINE 0.25% -1:200000 IJ SOLN
INTRAMUSCULAR | Status: DC | PRN
  Administered 2014-06-16: 15 mL

## 2014-06-16 MED ORDER — ONDANSETRON HCL 4 MG/2ML IJ SOLN: 4.0000 mg | Freq: Four times a day (QID) | INTRAMUSCULAR | Status: DC | PRN

## 2014-06-16 MED ORDER — OXYCODONE-ACETAMINOPHEN 5-325 MG PO TABS
1.0000 | ORAL_TABLET | ORAL | Status: DC | PRN
  Administered 2014-06-16 – 2014-06-17 (×5): 2 via ORAL
  Filled 2014-06-16 (×5): qty 2

## 2014-06-16 MED ORDER — LISINOPRIL 10 MG PO TABS
10.0000 mg | ORAL_TABLET | Freq: Every day | ORAL | Status: DC
  Filled 2014-06-16: qty 1

## 2014-06-16 MED ORDER — ONDANSETRON HCL 4 MG/2ML IJ SOLN
INTRAMUSCULAR | Status: DC | PRN
  Administered 2014-06-16: 4 mg via INTRAVENOUS

## 2014-06-16 MED ORDER — KETOROLAC TROMETHAMINE 30 MG/ML IJ SOLN
15.0000 mg | Freq: Once | INTRAMUSCULAR | Status: AC | PRN
  Administered 2014-06-16: 30 mg via INTRAVENOUS

## 2014-06-16 MED ORDER — NEOSTIGMINE METHYLSULFATE 10 MG/10ML IV SOLN
INTRAVENOUS | Status: AC
  Filled 2014-06-16: qty 1

## 2014-06-16 MED ORDER — METRONIDAZOLE IN NACL 5-0.79 MG/ML-% IV SOLN
500.0000 mg | Freq: Three times a day (TID) | INTRAVENOUS | Status: DC
  Administered 2014-06-16 – 2014-06-17 (×3): 500 mg via INTRAVENOUS
  Filled 2014-06-16 (×7): qty 100

## 2014-06-16 SURGICAL SUPPLY — 41 items
APPLIER CLIP 5 13 M/L LIGAMAX5 (MISCELLANEOUS) ×3
APPLIER CLIP ROT 10 11.4 M/L (STAPLE)
BLADE SURG ROTATE 9660 (MISCELLANEOUS) IMPLANT
CANISTER SUCTION 2500CC (MISCELLANEOUS) ×3 IMPLANT
CHLORAPREP W/TINT 26ML (MISCELLANEOUS) IMPLANT
CLIP APPLIE 5 13 M/L LIGAMAX5 (MISCELLANEOUS) ×1 IMPLANT
CLIP APPLIE ROT 10 11.4 M/L (STAPLE) IMPLANT
COVER MAYO STAND STRL (DRAPES) IMPLANT
COVER SURGICAL LIGHT HANDLE (MISCELLANEOUS) ×3 IMPLANT
DERMABOND ADVANCED (GAUZE/BANDAGES/DRESSINGS) ×2
DERMABOND ADVANCED .7 DNX12 (GAUZE/BANDAGES/DRESSINGS) ×1 IMPLANT
DRAPE C-ARM 42X72 X-RAY (DRAPES) IMPLANT
DRAPE UTILITY 15X26 W/TAPE STR (DRAPE) ×6 IMPLANT
DRSG TEGADERM 2-3/8X2-3/4 SM (GAUZE/BANDAGES/DRESSINGS) ×12 IMPLANT
DRSG TEGADERM 4X4.75 (GAUZE/BANDAGES/DRESSINGS) ×3 IMPLANT
ELECT REM PT RETURN 9FT ADLT (ELECTROSURGICAL) ×3
ELECTRODE REM PT RTRN 9FT ADLT (ELECTROSURGICAL) ×1 IMPLANT
GLOVE BIOGEL PI IND STRL 8 (GLOVE) ×1 IMPLANT
GLOVE BIOGEL PI INDICATOR 8 (GLOVE) ×2
GLOVE ECLIPSE 7.5 STRL STRAW (GLOVE) ×3 IMPLANT
GOWN STRL REUS W/ TWL LRG LVL3 (GOWN DISPOSABLE) ×3 IMPLANT
GOWN STRL REUS W/TWL LRG LVL3 (GOWN DISPOSABLE) ×6
KIT BASIN OR (CUSTOM PROCEDURE TRAY) ×3 IMPLANT
KIT ROOM TURNOVER OR (KITS) ×3 IMPLANT
NS IRRIG 1000ML POUR BTL (IV SOLUTION) ×3 IMPLANT
PAD ARMBOARD 7.5X6 YLW CONV (MISCELLANEOUS) ×3 IMPLANT
POUCH RETRIEVAL ECOSAC 10 (ENDOMECHANICALS) ×1 IMPLANT
POUCH RETRIEVAL ECOSAC 10MM (ENDOMECHANICALS) ×2
POUCH SPECIMEN RETRIEVAL 10MM (ENDOMECHANICALS) IMPLANT
SCISSORS LAP 5X35 DISP (ENDOMECHANICALS) ×3 IMPLANT
SET CHOLANGIOGRAPH 5 50 .035 (SET/KITS/TRAYS/PACK) IMPLANT
SET IRRIG TUBING LAPAROSCOPIC (IRRIGATION / IRRIGATOR) ×3 IMPLANT
SLEEVE ENDOPATH XCEL 5M (ENDOMECHANICALS) ×3 IMPLANT
SPECIMEN JAR SMALL (MISCELLANEOUS) ×3 IMPLANT
SUT MNCRL AB 4-0 PS2 18 (SUTURE) ×3 IMPLANT
TOWEL OR 17X24 6PK STRL BLUE (TOWEL DISPOSABLE) ×3 IMPLANT
TOWEL OR 17X26 10 PK STRL BLUE (TOWEL DISPOSABLE) ×3 IMPLANT
TRAY LAPAROSCOPIC (CUSTOM PROCEDURE TRAY) ×3 IMPLANT
TROCAR XCEL BLUNT TIP 100MML (ENDOMECHANICALS) ×3 IMPLANT
TROCAR XCEL NON-BLD 11X100MML (ENDOMECHANICALS) IMPLANT
TROCAR XCEL NON-BLD 5MMX100MML (ENDOMECHANICALS) ×3 IMPLANT

## 2014-06-16 NOTE — Anesthesia Preprocedure Evaluation (Addendum)
Anesthesia Evaluation  Patient identified by MRN, date of birth, ID band Patient awake    Reviewed: Allergy & Precautions, H&P , NPO status , Patient's Chart, lab work & pertinent test results  History of Anesthesia Complications Negative for: history of anesthetic complications  Airway Mallampati: II TM Distance: >3 FB Neck ROM: Full    Dental   Pulmonary neg shortness of breath, neg sleep apnea, neg COPDneg recent URI, Current Smoker,  breath sounds clear to auscultation        Cardiovascular hypertension, Pt. on medications - angina- CAD and - Past MI Rhythm:Regular     Neuro/Psych negative neurological ROS  negative psych ROS   GI/Hepatic negative GI ROS, Neg liver ROS,   Endo/Other  negative endocrine ROS  Renal/GU negative Renal ROS     Musculoskeletal   Abdominal   Peds  Hematology negative hematology ROS (+)   Anesthesia Other Findings   Reproductive/Obstetrics                          Anesthesia Physical Anesthesia Plan  ASA: II  Anesthesia Plan: General   Post-op Pain Management:    Induction: Intravenous, Rapid sequence and Cricoid pressure planned  Airway Management Planned: Oral ETT  Additional Equipment: None  Intra-op Plan:   Post-operative Plan: Extubation in OR  Informed Consent: I have reviewed the patients History and Physical, chart, labs and discussed the procedure including the risks, benefits and alternatives for the proposed anesthesia with the patient or authorized representative who has indicated his/her understanding and acceptance.   Dental advisory given  Plan Discussed with: CRNA and Surgeon  Anesthesia Plan Comments:        Anesthesia Quick Evaluation

## 2014-06-16 NOTE — ED Provider Notes (Signed)
CSN: [id number]     Arrival date & time 06/15/14  2306 History   First MD Initiated Contact with Patient 06/15/14 2338     Chief Complaint  Patient presents with  . Abdominal Pain     (Consider location/radiation/quality/duration/timing/severity/associated sxs/prior Treatment) HPI Comments: Patient is a 48 year old female who presents to the emergency department with complaint of abdominal pain, with nausea. The patient states that on August 1 she began having problems with increasing right upper quadrant and epigastric area pain, accompanied by nausea and occasional vomiting. Prior to this the patient was seen by her primary physician and an ultrasound was ordered, the patient did not get the results of the ultrasound so she came to the emergency department because of increasing pain. The patient was noted to have gallstones. The patient was placed on antibiotics. Patient was also referred to the Gen. surgery consult. The patient states her pain has never completely gone away but is getting progressively worse. The patient to Percocet tablets before coming to the emergency department and states that it" would not touch the pain". The patient has not had any high fever that she is aware of. She's not had any injury or trauma to the abdomen. His been no blood in the vomitus or stools. There's been no blood in urine. Pt had CT scan of abd/pelvis , this revealed gallstones, but no evidence of cholecystitis he or other and abdomen pelvis problems.  Patient is a 48 y.o. female presenting with abdominal pain. The history is provided by the patient.  Abdominal Pain Associated symptoms: nausea and vomiting   Associated symptoms: no chest pain, no cough, no dysuria, no hematuria and no shortness of breath     Past Medical History  Diagnosis Date  . Hypertension    Past Surgical History  Procedure Laterality Date  . Rotator cuff repair     No family history on file. History  Substance Use Topics  .  Smoking status: Current Every Day Smoker  . Smokeless tobacco: Not on file  . Alcohol Use: No   OB History   Grav Para Term Preterm Abortions TAB SAB Ect Mult Living                 Review of Systems  Constitutional: Negative for activity change.       All ROS Neg except as noted in HPI  HENT: Negative for nosebleeds.   Eyes: Negative for photophobia and discharge.  Respiratory: Negative for cough, shortness of breath and wheezing.   Cardiovascular: Negative for chest pain and palpitations.  Gastrointestinal: Positive for nausea, vomiting and abdominal pain. Negative for blood in stool.  Genitourinary: Negative for dysuria, frequency and hematuria.  Musculoskeletal: Negative for arthralgias, back pain and neck pain.  Skin: Negative.   Neurological: Negative for dizziness, seizures and speech difficulty.  Psychiatric/Behavioral: Negative for hallucinations and confusion.      Allergies  Rubbing alcohol  Home Medications   Prior to Admission medications   Medication Sig Start Date End Date Taking? Authorizing Provider  oxyCODONE-acetaminophen (PERCOCET) 10-325 MG per tablet Take 1 tablet by mouth every 4 (four) hours as needed for pain.   Yes Historical Provider, MD  lisinopril (PRINIVIL,ZESTRIL) 20 MG tablet Take 10 mg by mouth daily.    Historical Provider, MD   BP 140/114  Pulse 86  Temp(Src) 98.7 F (37.1 C) (Oral)  Resp 22  Ht 5\' 2"  (1.575 m)  Wt 169 lb (76.658 kg)  BMI 30.90 kg/m2  SpO2  100%  LMP 05/14/2014 Physical Exam  Nursing note and vitals reviewed. Constitutional: She is oriented to person, place, and time. She appears well-developed and well-nourished.  Non-toxic appearance. She appears distressed.  HENT:  Head: Normocephalic.  Right Ear: Tympanic membrane and external ear normal.  Left Ear: Tympanic membrane and external ear normal.  Eyes: EOM and lids are normal. Pupils are equal, round, and reactive to light.  Neck: Normal range of motion. Neck  supple. Carotid bruit is not present.  Cardiovascular: Normal rate, regular rhythm, normal heart sounds, intact distal pulses and normal pulses.   Pulmonary/Chest: Breath sounds normal. No respiratory distress.  Abdominal: Soft. Bowel sounds are normal. There is tenderness. There is guarding.   All the abdomen is soft with good bowel sounds.there is no mass appreciated. There is pain to palpation in the right upper quadrant and the epigastric area. No organomegaly appreciated.  Musculoskeletal: Normal range of motion.  Lymphadenopathy:       Head (right side): No submandibular adenopathy present.       Head (left side): No submandibular adenopathy present.    She has no cervical adenopathy.  Neurological: She is alert and oriented to person, place, and time. She has normal strength. No cranial nerve deficit or sensory deficit.  Skin: Skin is warm and dry.  Psychiatric: She has a normal mood and affect. Her speech is normal.    ED Course  Procedures (including critical care time) Labs Review Labs Reviewed  CBC WITH DIFFERENTIAL  COMPREHENSIVE METABOLIC PANEL  LIPASE, BLOOD    Imaging Review No results found.   EKG Interpretation None      MDM Temperature on admission is 98.7, pulse rate 86, respiratory rate 22, blood pressure 140/114 (elevated). Pulse oximetry 100% on room air. Within normal limits by my interpretation.  Patient initially noted to be in the floor on hands and knees of screaming and crying in pain. Patient had mild relief from 1 mg of Dilaudid. Significant improvement in pain after a second milligram of IV Dilaudid.  Complete blood count reveals the white blood cells to be elevated at 15,200, this is elevated beyond what the reading was recorded on October 3. The hematocrit is within normal limits at 45, the hematocrit is slightly low of 15.8. There is no noted shift to the left. Compared to metabolic panel shows a sodium of 135 which is slightly low. The total  bili is low at 0.2 otherwise within normal limits. Lipase is normal at 32. Surgical consultant called to discuss the case.   Case discussed with Dr Luisa Hartornett. [facility]   Final diagnoses:  None    **I have reviewed nursing notes, vital signs, and all appropriate lab and imaging results for this patient.Kathie Dike*    Allexus Ovens M Caelyn Route, PA-C 06/16/14 [id number]

## 2014-06-16 NOTE — ED Notes (Signed)
Called to room by pt son, pt was in floor on her knees vomiting in trash can. Yelling in pain holding her right side. Got patient back to bed and assessed pt. Pt stated she had been seen and treated for gallstones previously. Pt was crying and stated she had urinated on herself. At that time son was angry and demanding to see a doctor. Called Dr. Saul FordyceZacowski and informed him of pt's severe abd pain and vomiting, asked for pt to be seen soon.  He came to room within minutes. Iv was started, dilaudid and zofran was given.

## 2014-06-16 NOTE — ED Provider Notes (Signed)
PT reports she has had RUQ intermittently for the past month, states she thought last weekend was bad but her pain returned tonight about 9 pm after eating grilled steak, potato, and salad and is the worst she has experienced.  Sometimes the pain radiates into her back. She has had nausea and vomiting.  Pt has had pain medications but still appears uncomfortable, holding her right abdomen.   We discussed [facility] to be evaluated by the surgeon on call tonight. She states she has an appt with Dr Lovell SheehanJenkins on the 10th. PA Beverely PaceBryant has spoken to Dr Luisa Hartornett and he is going [facility].   02:38 Dr Fleet ContrasB Miller, Cheryll CockaynePod A, [facility] made aware of transfer and that patient is to be evaluated by Dr Luisa Hartornett   Medical screening examination/treatment/procedure(s) were conducted as a shared visit with non-physician practitioner(s) and myself.  I personally evaluated the patient during the encounter.   EKG Interpretation None       Devoria AlbeIva Burtis Imhoff, MD, Armando GangFACEP   Ward GivensIva L Wendel Homeyer, MD 06/16/14 [phone]

## 2014-06-16 NOTE — H&P (Signed)
Jamie Flowers is an 48 y.o. female.   Chief Complaint: ruq pain HPI: 66 yof who has several week history of intermittent ruq pain for which she has presented to [facility].  She has recent US with cholelithiasis and was diagnosed with biliary colic.  The last 24 hours she has begun to have unrelenting abdominal pain primarily ruq for which she cannot do anything to get better.  She is having pain while taking a deep breath and is moving all around.  She has no fever.  No emesis. No change in bowel habits.  She presented to [facility] er again and is transferred here for treatment. She is with her son.  Past Medical History  Diagnosis Date  . Hypertension     Past Surgical History  Procedure Laterality Date  . Rotator cuff repair      No family history on file. Social History:  reports that she has been smoking.  She does not have any smokeless tobacco history on file. She reports that she does not drink alcohol or use illicit drugs.  Allergies:  Allergies  Allergen Reactions  . Rubbing Alcohol [Isopropyl Alcohol]    Meds lisinopril, adderall  Results for orders placed during the hospital encounter of 06/15/14 (from the past 48 hour(s))  CBC WITH DIFFERENTIAL     Status: Abnormal   Collection Time    06/15/14 11:56 PM      Result Value Ref Range   WBC 15.2 (*) 4.0 - 10.5 K/uL   RBC 5.22 (*) 3.87 - 5.11 MIL/uL   Hemoglobin 15.8 (*) 12.0 - 15.0 g/dL   HCT 45.0  36.0 - 46.0 %   MCV 86.2  78.0 - 100.0 fL   MCH 30.3  26.0 - 34.0 pg   MCHC 35.1  30.0 - 36.0 g/dL   RDW 13.8  11.5 - 15.5 %   Platelets 373  150 - 400 K/uL   Neutrophils Relative % 68  43 - 77 %   Neutro Abs 10.4 (*) 1.7 - 7.7 K/uL   Lymphocytes Relative 23  12 - 46 %   Lymphs Abs 3.5  0.7 - 4.0 K/uL   Monocytes Relative 6  3 - 12 %   Monocytes Absolute 1.0  0.1 - 1.0 K/uL   Eosinophils Relative 2  0 - 5 %   Eosinophils Absolute 0.2  0.0 - 0.7 K/uL   Basophils Relative 1  0 - 1 %   Basophils Absolute 0.1  0.0 - 0.1 K/uL   COMPREHENSIVE METABOLIC PANEL     Status: Abnormal   Collection Time    06/15/14 11:56 PM      Result Value Ref Range   Sodium 135 (*) 137 - 147 mEq/L   Potassium 4.1  3.7 - 5.3 mEq/L   Chloride 100  96 - 112 mEq/L   CO2 20  19 - 32 mEq/L   Glucose, Bld 96  70 - 99 mg/dL   BUN 17  6 - 23 mg/dL   Creatinine, Ser 0.93  0.50 - 1.10 mg/dL   Calcium 10.5  8.4 - 10.5 mg/dL   Total Protein 7.6  6.0 - 8.3 g/dL   Albumin 3.8  3.5 - 5.2 g/dL   AST 16  0 - 37 U/L   ALT 18  0 - 35 U/L   Alkaline Phosphatase 88  39 - 117 U/L   Total Bilirubin 0.2 (*) 0.3 - 1.2 mg/dL   GFR calc non Af Amer 73 (*) >  90 mL/min   GFR calc Af Amer 84 (*) >90 mL/min   Comment: (NOTE)     The eGFR has been calculated using the CKD EPI equation.     This calculation has not been validated in all clinical situations.     eGFR's persistently <90 mL/min signify possible Chronic Kidney     Disease.   Anion gap 15  5 - 15  LIPASE, BLOOD     Status: None   Collection Time    06/15/14 11:56 PM      Result Value Ref Range   Lipase 32  11 - 59 U/L   Dg Abd Acute W/chest  06/16/2014   CLINICAL DATA:  Mid abdominal pain and right-sided chest pain.  EXAM: ACUTE ABDOMEN SERIES (ABDOMEN 2 VIEW & CHEST 1 VIEW)  COMPARISON:  Chest radiograph from 05/09/2014, and CT of the chest, abdomen and pelvis performed 06/13/2014  FINDINGS: The lungs are well-aerated. Mild chronic peribronchial thickening is noted. There is no evidence of focal opacification, pleural effusion or pneumothorax. The cardiomediastinal silhouette is within normal limits.  The visualized bowel gas pattern is unremarkable. Scattered stool and air are seen within the colon; there is no evidence of small bowel dilatation to suggest obstruction. No free intra-abdominal air is identified on the provided upright view.  No acute osseous abnormalities are seen; the sacroiliac joints are unremarkable in appearance.  IMPRESSION: 1. Unremarkable bowel gas pattern; no free  intra-abdominal air seen. Relatively small amount of stool noted in the colon. 2. No acute cardiopulmonary process seen. Mild chronic peribronchial thickening noted.   Electronically Signed   By: Garald Balding M.D.   On: 06/16/2014 02:16    Review of Systems  Constitutional: Negative for fever.  Respiratory: Negative for cough.   Gastrointestinal: Positive for abdominal pain. Negative for vomiting, diarrhea and constipation.  Genitourinary: Negative for dysuria, urgency and frequency.    Blood pressure 101/49, pulse 67, temperature 97.7 F (36.5 C), temperature source Oral, resp. rate 28, height 5' 2"  (1.575 m), weight 169 lb (76.658 kg), last menstrual period 05/14/2014, SpO2 100.00%. Physical Exam  Constitutional: She is oriented to person, place, and time. She appears well-developed and well-nourished.  Eyes: No scleral icterus.  Cardiovascular: Normal rate, regular rhythm and normal heart sounds.   Respiratory: Effort normal and breath sounds normal. She has no wheezes. She has no rales.  GI: Soft. Bowel sounds are normal. She exhibits no distension. There is tenderness in the right upper quadrant. There is positive Murphy's sign.  Neurological: She is alert and oriented to person, place, and time.     Assessment/Plan Cholecystitis  I think she has cholecystitis. She has fair amount of pain and is very uncomfortable. I thin proceeding with lap chole today would be best plan and I told her one of my partners would do this likely later today. Will admit, npo, iv abx.  Cleatus Goodin 06/16/2014, 4:23 AM

## 2014-06-16 NOTE — Op Note (Addendum)
OPERATIVE REPORT  DATE OF OPERATION: 06/15/2014 - 06/16/2014  PATIENT:  Jamie Flowers  48 y.o. female  PRE-OPERATIVE DIAGNOSIS:  cholecystitits  POST-OPERATIVE DIAGNOSIS:  Acute cholecystitis with an obstruction stone fo the infundibulum  PROCEDURE:  Procedure(s): LAPAROSCOPIC CHOLECYSTECTOMY   SURGEON:  Surgeon(s): Cherylynn RidgesJames O Tamani Durney, MD  ASSISTANT: None  ANESTHESIA:   general  EBL: <30 ml  BLOOD ADMINISTERED: none  DRAINS: none   SPECIMEN:  Source of Specimen:  Gallbladder with stone  COUNTS CORRECT:  YES  PROCEDURE DETAILS: The patient was taken to the operating room and placed on the table in the supine position.  After an adequate endotracheal anesthetic was administered, the patient was prepped with Betadine, and then draped in the usual manner exposing the entire abdomen laterally, inferiorly and up  to the costal margins.  After a proper timeout was performed including identifying the patient and the procedure to be performed, a supraumbilical 1.5cm midline incision was made using a #15 blade.  This was taken down to the fascia which was then incised with a #15 blade.  The edges of the fascia were tented up with Kocher clamps as the preperitoneal space was penetrated with a Kelly clamp into the peritoneum.  Once this was done, a pursestring suture of 0 Vicryl was passed around the fascial opening.  This was subsequently used to secure the [facility] cannula which was passed into the peritoneal cavity.  Once the [facility] cannula was in place, carbon dioxide gas was insufflated into the peritoneal cavity up to a maximal intra-abdominal pressure of 15mm Hg.The laparoscope, with attached camera and light source, was passed into the peritoneal cavity to visualize the direct insertion of two right upper quadrant 5mm cannulas, and a sup-xiphoid 5mm cannula.  Once all cannulas were in place, the dissection was begun.  There was some thickening of the gallbladder wall and edema in the gallbladder  bed. There were also omental adhesions to the gallbladder. Definite indications for acute cholecystitis. There was one single stone was blocking the neck and infundibulum and the cystic duct.  Two ratcheted graspers were attached to the dome and infundibulum of the gallbladder and retracted towards the anterior abdominal wall and the right upper quadrant.  Using cautery attached to a dissecting forceps, the peritoneum overlaying the triangle of Chalot and the hepatoduodenal triangle was dissected away exposing the cystic duct and the cystic artery.  The cystic artery was clipped proximally and distally then transected.  A clip was placed on the gallbladder side of the cystic duct, then  the distal cystic duct was clipped multiple times then transected.  The gallbladder was then dissected out of the hepatic bed without event.  It was retrieved from the abdomen (using an Eco-pouch bag) without event.  Once the gallbladder was removed, the bed was inspected for hemostasis.  Once excellent hemostasis was obtained all gas and fluids were aspirated from above the liver, then the cannulas were removed.  The supraumbilical incision was closed using the pursestring suture which was in place.  0.25% bupivicaine with epinephrine was injected at all sites.  All 10mm or greater cannula sites were close using a running subcuticular stitch of 4-0 Monocryl.  5.290mm cannula sites were closed with Dermabond only.Steri-Strips and Tagaderm were used to complete the dressings at all sites.  At this point all needle, sponge, and instrument counts were correct.The patient was awakened from anesthesia and taken to the PACU in stable condition.  PATIENT DISPOSITION:  PACU - hemodynamically stable.  Cherylynn Ridges 8/9/20151:59 PM

## 2014-06-16 NOTE — ED Notes (Signed)
MD Miller at bedside. 

## 2014-06-16 NOTE — Anesthesia Postprocedure Evaluation (Signed)
  Anesthesia Post-op Note  Patient: Jamie Flowers  Procedure(s) Performed: Procedure(s): LAPAROSCOPIC CHOLECYSTECTOMY  (N/A)  Patient Location: PACU  Anesthesia Type:General  Level of Consciousness: awake  Airway and Oxygen Therapy: Patient Spontanous Breathing and Patient connected to nasal cannula oxygen  Post-op Pain: mild  Post-op Assessment: Post-op Vital signs reviewed, Patient's Cardiovascular Status Stable, Respiratory Function Stable, Patent Airway, No signs of Nausea or vomiting and Pain level controlled  Post-op Vital Signs: Reviewed and stable  Last Vitals:  Filed Vitals:   06/16/14 1452  BP: 106/43  Pulse: 72  Temp: 36.6 C  Resp: 14    Complications: No apparent anesthesia complications

## 2014-06-16 NOTE — Transfer of Care (Signed)
Immediate Anesthesia Transfer of Care Note  Patient: Jamie Flowers  Procedure(s) Performed: Procedure(s): LAPAROSCOPIC CHOLECYSTECTOMY  (N/A)  Patient Location: PACU  Anesthesia Type:General  Level of Consciousness: awake and alert   Airway & Oxygen Therapy: Patient Spontanous Breathing  Post-op Assessment: Report given to PACU RN and Post -op Vital signs reviewed and stable  Post vital signs: Reviewed and stable  Complications: No apparent anesthesia complications

## 2014-06-16 NOTE — ED Notes (Signed)
Patient evaluated, right upper quadrant pain is persistent, prior CT scan and ultrasound confirms that she has cholelithiasis and now with symptoms that could be cholecystitis. Dr. Dwain SarnaWakefield will see the patient in the emergency department.  Vida RollerBrian D Izack Hoogland, MD 06/16/14 [phone]

## 2014-06-16 NOTE — ED Notes (Addendum)
Pt received from [facility]. [facility], recent dx of gallstones. Patient reports uncontrolled pain and intermittent episodes of nausea.

## 2014-06-16 NOTE — Progress Notes (Signed)
Patient anxious.  Will call for surgery soon.    Marta LamasJames O. Gae BonWyatt, III, MD, FACS [phone]--pager [phone]--office [facility]

## 2014-06-17 ENCOUNTER — Encounter (HOSPITAL_COMMUNITY): Payer: Self-pay | Admitting: General Practice

## 2014-06-17 LAB — COMPREHENSIVE METABOLIC PANEL
ALT: 36 U/L — ABNORMAL HIGH (ref 0–35)
AST: 32 U/L (ref 0–37)
Albumin: 2.8 g/dL — ABNORMAL LOW (ref 3.5–5.2)
Alkaline Phosphatase: 62 U/L (ref 39–117)
Anion gap: 8 (ref 5–15)
BILIRUBIN TOTAL: 0.5 mg/dL (ref 0.3–1.2)
BUN: 12 mg/dL (ref 6–23)
CHLORIDE: 104 meq/L (ref 96–112)
CO2: 25 mEq/L (ref 19–32)
Calcium: 8.2 mg/dL — ABNORMAL LOW (ref 8.4–10.5)
Creatinine, Ser: 0.92 mg/dL (ref 0.50–1.10)
GFR calc Af Amer: 85 mL/min — ABNORMAL LOW (ref >90)
GFR calc non Af Amer: 74 mL/min — ABNORMAL LOW (ref >90)
Glucose, Bld: 80 mg/dL (ref 70–99)
POTASSIUM: 4 meq/L (ref 3.7–5.3)
Sodium: 137 mEq/L (ref 137–147)
TOTAL PROTEIN: 5.5 g/dL — AB (ref 6.0–8.3)

## 2014-06-17 LAB — CBC
HCT: 36.1 % (ref 36.0–46.0)
HEMOGLOBIN: 11.8 g/dL — AB (ref 12.0–15.0)
MCH: 29.2 pg (ref 26.0–34.0)
MCHC: 32.7 g/dL (ref 30.0–36.0)
MCV: 89.4 fL (ref 78.0–100.0)
Platelets: 288 10*3/uL (ref 150–400)
RBC: 4.04 MIL/uL (ref 3.87–5.11)
RDW: 14 % (ref 11.5–15.5)
WBC: 13.5 10*3/uL — ABNORMAL HIGH (ref 4.0–10.5)

## 2014-06-17 MED ORDER — OXYCODONE-ACETAMINOPHEN 5-325 MG PO TABS: 1.0000 | ORAL_TABLET | Freq: Four times a day (QID) | ORAL | Status: DC | PRN

## 2014-06-17 MED ORDER — GUAIFENESIN 100 MG/5ML PO SYRP
200.0000 mg | ORAL_SOLUTION | Freq: Four times a day (QID) | ORAL | Status: DC | PRN
  Administered 2014-06-17: 200 mg via ORAL
  Filled 2014-06-17: qty 10

## 2014-06-17 MED ORDER — PNEUMOCOCCAL VAC POLYVALENT 25 MCG/0.5ML IJ INJ
0.5000 mL | INJECTION | INTRAMUSCULAR | Status: DC
  Filled 2014-06-17 (×2): qty 0.5

## 2014-06-17 MED ORDER — SODIUM CHLORIDE 0.9 % IV BOLUS (SEPSIS)
500.0000 mL | Freq: Once | INTRAVENOUS | Status: AC
  Administered 2014-06-17: 500 mL via INTRAVENOUS

## 2014-06-17 MED ORDER — PNEUMOCOCCAL VAC POLYVALENT 25 MCG/0.5ML IJ INJ: 0.5000 mL | INJECTION | INTRAMUSCULAR | Status: DC

## 2014-06-17 NOTE — Progress Notes (Signed)
1 Day Post-Op  Subjective: Stable and alert. Has tolerated clear liquids. Has ambulated to bathroom. A little fearful of going home but willing to consider later today  Afebrile. BP 102/51. Heart rate 64.Takes lisinopril for hypertension.  Objective: Vital signs in last 24 hours: Temp:  [97.2 F (36.2 C)-98.3 F (36.8 C)] 98.3 F (36.8 C) (08/10 0540) Pulse Rate:  [60-92] 64 (08/10 0540) Resp:  [14-19] 18 (08/10 0540) BP: (88-124)/(43-75) 102/51 mmHg (08/10 0540) SpO2:  [95 %-100 %] 97 % (08/10 0540) Last BM Date: 06/15/14  Intake/Output from previous day: 08/09 0701 - 08/10 0700 In: 2000 [I.V.:2000] Out: -  Intake/Output this shift:    General appearance: alert. Cooperative. Soft-spoken. Depressed affect. Mental status otherwise normal. Resp: clear to auscultation bilaterally GI: soft. Not distended. Wounds looked good. A little tender around the umbilicus. Appropriate..  Lab Results:  Results for orders placed during the hospital encounter of 06/15/14 (from the past 24 hour(s))  COMPREHENSIVE METABOLIC PANEL     Status: Abnormal   Collection Time    06/17/14  3:40 AM      Result Value Ref Range   Sodium 137  137 - 147 mEq/L   Potassium 4.0  3.7 - 5.3 mEq/L   Chloride 104  96 - 112 mEq/L   CO2 25  19 - 32 mEq/L   Glucose, Bld 80  70 - 99 mg/dL   BUN 12  6 - 23 mg/dL   Creatinine, Ser 4.090.92  0.50 - 1.10 mg/dL   Calcium 8.2 (*) 8.4 - 10.5 mg/dL   Total Protein 5.5 (*) 6.0 - 8.3 g/dL   Albumin 2.8 (*) 3.5 - 5.2 g/dL   AST 32  0 - 37 U/L   ALT 36 (*) 0 - 35 U/L   Alkaline Phosphatase 62  39 - 117 U/L   Total Bilirubin 0.5  0.3 - 1.2 mg/dL   GFR calc non Af Amer 74 (*) >90 mL/min   GFR calc Af Amer 85 (*) >90 mL/min   Anion gap 8  5 - 15  CBC     Status: Abnormal   Collection Time    06/17/14  3:40 AM      Result Value Ref Range   WBC 13.5 (*) 4.0 - 10.5 K/uL   RBC 4.04  3.87 - 5.11 MIL/uL   Hemoglobin 11.8 (*) 12.0 - 15.0 g/dL   HCT 81.136.1  91.436.0 - 78.246.0 %   MCV  89.4  78.0 - 100.0 fL   MCH 29.2  26.0 - 34.0 pg   MCHC 32.7  30.0 - 36.0 g/dL   RDW 95.614.0  21.311.5 - 08.615.5 %   Platelets 288  150 - 400 K/uL     Studies/Results: Dg Abd Acute W/chest  06/16/2014   CLINICAL DATA:  Mid abdominal pain and right-sided chest pain.  EXAM: ACUTE ABDOMEN SERIES (ABDOMEN 2 VIEW & CHEST 1 VIEW)  COMPARISON:  Chest radiograph from 05/09/2014, and CT of the chest, abdomen and pelvis performed 06/13/2014  FINDINGS: The lungs are well-aerated. Mild chronic peribronchial thickening is noted. There is no evidence of focal opacification, pleural effusion or pneumothorax. The cardiomediastinal silhouette is within normal limits.  The visualized bowel gas pattern is unremarkable. Scattered stool and air are seen within the colon; there is no evidence of small bowel dilatation to suggest obstruction. No free intra-abdominal air is identified on the provided upright view.  No acute osseous abnormalities are seen; the sacroiliac joints are unremarkable in appearance.  IMPRESSION: 1. Unremarkable bowel gas pattern; no free intra-abdominal air seen. Relatively small amount of stool noted in the colon. 2. No acute cardiopulmonary process seen. Mild chronic peribronchial thickening noted.   Electronically Signed   By: Roanna Raider M.D.   On: 06/16/2014 02:16    . amphetamine-dextroamphetamine  20 mg Oral Daily  . cefTRIAXone (ROCEPHIN)  IV  2 g Intravenous Daily  . enoxaparin (LOVENOX) injection  40 mg Subcutaneous Q24H  . lisinopril  10 mg Oral Daily  . metronidazole  500 mg Intravenous Q8H     Assessment/Plan: s/p Procedure(s): LAPAROSCOPIC CHOLECYSTECTOMY   POD #1. Laparoscopic cholecystectomy. Stable Advance diet Ambulate in halls Possible discharge this afternoon.  Hypertension. Well controlled.  @PROBHOSP @  LOS: 2 days    Jamie Flowers M 06/17/2014  . .prob

## 2014-06-17 NOTE — Progress Notes (Signed)
Chaplain responded to consult for an Advanced Directive. Ms. Jamie Flowers requested some information about the document so that she could appoint her only son to be in charge of the medical decision making if certain conditions befall her. Chaplain explained document and Pt expressed that she will go over it with her son and call me if any questions rose. Pt disclosed that she was disoriented on post-op medications and would like to talk in detail a little later. Gala RomneyLarry J Brown

## 2014-06-17 NOTE — Discharge Summary (Signed)
Agree with d/c assessment and followup plans.   Angelia MouldHaywood M. Derrell LollingIngram, M.D., [facility] General and Minimally invasive Surgery Breast and Colorectal Surgery Office:   [phone] Pager:   [phone]

## 2014-06-17 NOTE — Progress Notes (Signed)
BP 88/52. HR 61. Patient asymptomatic.  MD on call notified; verbal orders for 500 cc NS bolus given, and bolus administered.  Will recheck BP after bolus is finished.  Will continue to monitor patient.

## 2014-06-17 NOTE — Progress Notes (Signed)
UR Completed.  Jamie Flowers Jane [phone] 06/17/2014  

## 2014-06-17 NOTE — Discharge Summary (Signed)
[facility] Discharge Summary   Patient ID: Jamie Flowers MRN: [id number] DOB/AGE: 16-Mar-1966 48 y.o.  Admit date: 06/15/2014 Discharge date: 06/17/2014  Admitting Diagnosis: Cholecystitis  Discharge Diagnosis Patient Active Problem List   Diagnosis Date Noted  . Cholecystitis 06/16/2014  . Pain in joint, shoulder region 02/26/2014  . Decreased range of motion of left shoulder 02/26/2014  . Tight fascia 02/26/2014  . Muscle weakness (generalized) 02/26/2014    Consultants None  Imaging: Dg Abd Acute W/chest  06/16/2014   CLINICAL DATA:  Mid abdominal pain and right-sided chest pain.  EXAM: ACUTE ABDOMEN SERIES (ABDOMEN 2 VIEW & CHEST 1 VIEW)  COMPARISON:  Chest radiograph from 05/09/2014, and CT of the chest, abdomen and pelvis performed 06/13/2014  FINDINGS: The lungs are well-aerated. Mild chronic peribronchial thickening is noted. There is no evidence of focal opacification, pleural effusion or pneumothorax. The cardiomediastinal silhouette is within normal limits.  The visualized bowel gas pattern is unremarkable. Scattered stool and air are seen within the colon; there is no evidence of small bowel dilatation to suggest obstruction. No free intra-abdominal air is identified on the provided upright view.  No acute osseous abnormalities are seen; the sacroiliac joints are unremarkable in appearance.  IMPRESSION: 1. Unremarkable bowel gas pattern; no free intra-abdominal air seen. Relatively small amount of stool noted in the colon. 2. No acute cardiopulmonary process seen. Mild chronic peribronchial thickening noted.   Electronically Signed   By: Roanna RaiderJeffery  Chang M.D.   On: 06/16/2014 02:16    Procedures Dr. Lindie SpruceWyatt (07/16/14) - [facility]   Hospital Course:  48 yo female who has several week history of intermittent ruq pain for which she has presented to [facility].  She has recent us with cholelithiasis and was diagnosed with biliary colic. The last 24  hours she has begun to have unrelenting abdominal pain primarily ruq for which she cannot do anything to get better. She is having pain while taking a deep breath and is moving all around. She has no fever. No emesis. No change in bowel habits. She presented to [facility] and is transferred here for treatment. She is with her son.  Workup showed cholecystitis.  Patient was admitted and underwent procedure listed above.  Tolerated procedure well and was transferred to the floor.  Diet was advanced as tolerated.  On POD#1, the patient was voiding well, tolerating diet, ambulating well, pain well controlled, vital signs stable, incisions c/d/i and felt stable for discharge home.  Patient will follow up in our office in 4 weeks and knows to call with questions or concerns.      Medication List         amphetamine-dextroamphetamine 20 MG tablet  Commonly known as:  ADDERALL  Take 20 mg by mouth daily.     lisinopril 20 MG tablet  Commonly known as:  PRINIVIL,ZESTRIL  Take 10 mg by mouth daily.     oxyCODONE-acetaminophen 5-325 MG per tablet  Commonly known as:  PERCOCET/ROXICET  Take 1-2 tablets by mouth every 6 (six) hours as needed for moderate pain.         Follow-up Information   Follow up with [facility] On 07/16/2014. (For post-operation check. Your appointment is at 1:30pm, please arrive at least 30 min before your appointment to complete your check in paperwork.  If you are unable to arrive 30 min prior to your appointment time we may have to cancel or reschedule you)  Contact information:   [address]   [place] [address] [phone]       Signed: Candiss Norse [facility] [phone]  06/17/2014, 8:51 AM

## 2014-06-17 NOTE — Discharge Instructions (Signed)
Your appointment is at 1:30pm, please arrive at least 30 min before your appointment to complete your check in paperwork.  If you are unable to arrive 30 min prior to your appointment time we may have to cancel or reschedule you.  LAPAROSCOPIC SURGERY: POST OP INSTRUCTIONS  1. DIET: Follow a light bland diet the first 24 hours after arrival home, such as soup, liquids, crackers, etc. Be sure to include lots of fluids daily. Avoid fast food or heavy meals as your are more likely to get nauseated. Eat a low fat the next few days after surgery.  2. Take your usually prescribed home medications unless otherwise directed. 3. PAIN CONTROL:  a. Pain is best controlled by a usual combination of three different methods TOGETHER:  i. Ice/Heat ii. Over the counter pain medication iii. Prescription pain medication b. Most patients will experience some swelling and bruising around the incisions. Ice packs or heating pads (30-60 minutes up to 6 times a day) will help. Use ice for the first few days to help decrease swelling and bruising, then switch to heat to help relax tight/sore spots and speed recovery. Some people prefer to use ice alone, heat alone, alternating between ice & heat. Experiment to what works for you. Swelling and bruising can take several weeks to resolve.  c. It is helpful to take an over-the-counter pain medication regularly for the first few weeks. Choose one of the following that works best for you:  i. Naproxen (Aleve, etc) Two 220mg  tabs twice a day ii. Ibuprofen (Advil, etc) Three 200mg  tabs four times a day (every meal & bedtime) iii. Acetaminophen (Tylenol, etc) 500-650mg  four times a day (every meal & bedtime) d. A prescription for pain medication (such as oxycodone, hydrocodone, etc) should be given to you upon discharge. Take your pain medication as prescribed.  i. If you are having problems/concerns with the prescription medicine (does not control pain, nausea, vomiting, rash,  itching, etc), please call us [phone] to see if we need to switch you to a different pain medicine that will work better for you and/or control your side effect better. ii. If you need a refill on your pain medication, please contact your pharmacy. They will contact our office to request authorization. Prescriptions will not be filled after 5 pm or on week-ends. 4. Avoid getting constipated. Between the surgery and the pain medications, it is common to experience some constipation. Increasing fluid intake and taking a fiber supplement (such as Metamucil, Citrucel, FiberCon, MiraLax, etc) 1-2 times a day regularly will usually help prevent this problem from occurring. A mild laxative (prune juice, Milk of Magnesia, MiraLax, etc) should be taken according to package directions if there are no bowel movements after 48 hours.  5. Watch out for diarrhea. If you have many loose bowel movements, simplify your diet to bland foods & liquids for a few days. Stop any stool softeners and decrease your fiber supplement. Switching to mild anti-diarrheal medications (Kayopectate, Pepto Bismol) can help. If this worsens or does not improve, please call us. 6. Wash / shower every day. You may shower over the dressings as they are waterproof. Continue to shower over incision(s) after the dressing is off. 7. Remove your waterproof bandages 5 days after surgery. You may leave the incision open to air. You may replace a dressing/Band-Aid to cover the incision for comfort if you wish.  8. ACTIVITIES as tolerated:  a. You may resume regular (light) daily activities beginning the next day--such as  daily self-care, walking, climbing stairs--gradually increasing activities as tolerated. If you can walk 30 minutes without difficulty, it is safe to try more intense activity such as jogging, treadmill, bicycling, low-impact aerobics, swimming, etc. b. Save the most intensive and strenuous activity for last such as sit-ups, heavy  lifting, contact sports, etc Refrain from any heavy lifting or straining until you are off narcotics for pain control.  c. DO NOT PUSH THROUGH PAIN. Let pain be your guide: If it hurts to do something, don't do it. Pain is your body warning you to avoid that activity for another week until the pain goes down. d. You may drive when you are no longer taking prescription pain medication, you can comfortably wear a seatbelt, and you can safely maneuver your car and apply brakes. e. Dennis Bast may have sexual intercourse when it is comfortable.  9. FOLLOW UP in our office  a. Please call [facility] at [phone] to set up an appointment to see your surgeon in the office for a follow-up appointment approximately 2-3 weeks after your surgery. b. Make sure that you call for this appointment the day you arrive home to insure a convenient appointment time.      10. IF YOU HAVE DISABILITY OR FAMILY LEAVE FORMS, BRING THEM TO THE               OFFICE FOR PROCESSING.   WHEN TO CALL us [phone]:  1. Poor pain control 2. Reactions / problems with new medications (rash/itching, nausea, etc)  3. Fever over 101.5 F (38.5 C) 4. Inability to urinate 5. Nausea and/or vomiting 6. Worsening swelling or bruising 7. Continued bleeding from incision. 8. Increased pain, redness, or drainage from the incision  The clinic staff is available to answer your questions during regular business hours (8:30am-5pm). Please dont hesitate to call and ask to speak to one of our nurses for clinical concerns.  If you have a medical emergency, go to the nearest emergency room or call 911.  A surgeon from [facility] is always on call at the [facility], [place], [place], [place], [place] [address] ?  MAIN: [phone] ? TOLL FREE: [phone] ?  FAX [phone]  [email or web address]

## 2014-06-17 NOTE — Progress Notes (Signed)
Discussed discharge summary with patient. Reviewed all medications with patient. Patient received Rx. Answered all patient questions and concerns. Patient ready for discharge.

## 2014-07-16 ENCOUNTER — Encounter (INDEPENDENT_AMBULATORY_CARE_PROVIDER_SITE_OTHER): Payer: BC Managed Care – PPO

## 2015-09-08 ENCOUNTER — Emergency Department (HOSPITAL_COMMUNITY): Payer: Self-pay

## 2015-09-08 ENCOUNTER — Inpatient Hospital Stay (HOSPITAL_COMMUNITY)
Admission: EM | Admit: 2015-09-08 | Discharge: 2015-09-11 | DRG: 391 | Disposition: A | Discharge status: Home / Self Care | Payer: Self-pay | Attending: Internal Medicine | Admitting: Internal Medicine

## 2015-09-08 ENCOUNTER — Encounter (HOSPITAL_COMMUNITY): Payer: Self-pay | Admitting: Emergency Medicine

## 2015-09-08 DIAGNOSIS — R935 Abnormal findings on diagnostic imaging of other abdominal regions, including retroperitoneum: Secondary | ICD-10-CM | POA: Diagnosis present

## 2015-09-08 DIAGNOSIS — D72829 Elevated white blood cell count, unspecified: Secondary | ICD-10-CM

## 2015-09-08 DIAGNOSIS — R197 Diarrhea, unspecified: Principal | ICD-10-CM

## 2015-09-08 DIAGNOSIS — E860 Dehydration: Secondary | ICD-10-CM | POA: Diagnosis present

## 2015-09-08 DIAGNOSIS — I10 Essential (primary) hypertension: Secondary | ICD-10-CM

## 2015-09-08 DIAGNOSIS — R918 Other nonspecific abnormal finding of lung field: Secondary | ICD-10-CM

## 2015-09-08 DIAGNOSIS — R1110 Vomiting, unspecified: Secondary | ICD-10-CM | POA: Insufficient documentation

## 2015-09-08 DIAGNOSIS — Z8701 Personal history of pneumonia (recurrent): Secondary | ICD-10-CM

## 2015-09-08 DIAGNOSIS — Z87891 Personal history of nicotine dependence: Secondary | ICD-10-CM

## 2015-09-08 DIAGNOSIS — R32 Unspecified urinary incontinence: Secondary | ICD-10-CM | POA: Diagnosis present

## 2015-09-08 DIAGNOSIS — E871 Hypo-osmolality and hyponatremia: Secondary | ICD-10-CM | POA: Diagnosis present

## 2015-09-08 DIAGNOSIS — K2970 Gastritis, unspecified, without bleeding: Secondary | ICD-10-CM | POA: Diagnosis present

## 2015-09-08 DIAGNOSIS — D649 Anemia, unspecified: Secondary | ICD-10-CM | POA: Diagnosis present

## 2015-09-08 DIAGNOSIS — M5432 Sciatica, left side: Secondary | ICD-10-CM | POA: Diagnosis present

## 2015-09-08 DIAGNOSIS — K6289 Other specified diseases of anus and rectum: Secondary | ICD-10-CM | POA: Diagnosis present

## 2015-09-08 DIAGNOSIS — R509 Fever, unspecified: Secondary | ICD-10-CM

## 2015-09-08 DIAGNOSIS — K219 Gastro-esophageal reflux disease without esophagitis: Secondary | ICD-10-CM | POA: Diagnosis present

## 2015-09-08 DIAGNOSIS — J189 Pneumonia, unspecified organism: Secondary | ICD-10-CM

## 2015-09-08 DIAGNOSIS — Z801 Family history of malignant neoplasm of trachea, bronchus and lung: Secondary | ICD-10-CM

## 2015-09-08 HISTORY — DX: Elevated white blood cell count, unspecified: D72.829

## 2015-09-08 HISTORY — DX: Gastro-esophageal reflux disease without esophagitis: K21.9

## 2015-09-08 LAB — URINALYSIS, ROUTINE W REFLEX MICROSCOPIC
BILIRUBIN URINE: NEGATIVE
GLUCOSE, UA: NEGATIVE mg/dL
KETONES UR: NEGATIVE mg/dL
Leukocytes, UA: NEGATIVE
Nitrite: NEGATIVE
PROTEIN: NEGATIVE mg/dL
Specific Gravity, Urine: <1.005 — ABNORMAL LOW (ref 1.005–1.030)
Urobilinogen, UA: 0.2 mg/dL (ref 0.0–1.0)
pH: 5 (ref 5.0–8.0)

## 2015-09-08 LAB — URINE MICROSCOPIC-ADD ON

## 2015-09-08 LAB — CBC
HCT: 32.7 % — ABNORMAL LOW (ref 36.0–46.0)
HEMOGLOBIN: 10.9 g/dL — AB (ref 12.0–15.0)
MCH: 26.7 pg (ref 26.0–34.0)
MCHC: 33.3 g/dL (ref 30.0–36.0)
MCV: 80 fL (ref 78.0–100.0)
PLATELETS: 654 10*3/uL — AB (ref 150–400)
RBC: 4.09 MIL/uL (ref 3.87–5.11)
RDW: 14.8 % (ref 11.5–15.5)
WBC: 20.9 10*3/uL — AB (ref 4.0–10.5)

## 2015-09-08 LAB — BASIC METABOLIC PANEL
ANION GAP: 8 (ref 5–15)
BUN: 10 mg/dL (ref 6–20)
CO2: 25 mmol/L (ref 22–32)
CREATININE: 0.91 mg/dL (ref 0.44–1.00)
Calcium: 9 mg/dL (ref 8.9–10.3)
Chloride: 99 mmol/L — ABNORMAL LOW (ref 101–111)
GLUCOSE: 113 mg/dL — AB (ref 65–99)
Potassium: 4.1 mmol/L (ref 3.5–5.1)
Sodium: 132 mmol/L — ABNORMAL LOW (ref 135–145)

## 2015-09-08 MED ORDER — SODIUM CHLORIDE 0.9 % IV BOLUS (SEPSIS)
1000.0000 mL | Freq: Once | INTRAVENOUS | Status: AC
  Administered 2015-09-09: 1000 mL via INTRAVENOUS

## 2015-09-08 MED ORDER — SODIUM CHLORIDE 0.9 % IV BOLUS (SEPSIS)
1000.0000 mL | Freq: Once | INTRAVENOUS | Status: AC
  Administered 2015-09-08: 1000 mL via INTRAVENOUS

## 2015-09-08 MED ORDER — ACETAMINOPHEN 500 MG PO TABS
1000.0000 mg | ORAL_TABLET | Freq: Once | ORAL | Status: AC
  Administered 2015-09-08: 1000 mg via ORAL
  Filled 2015-09-08: qty 2

## 2015-09-08 MED ORDER — DEXTROSE 5 % IV SOLN
2.0000 g | Freq: Once | INTRAVENOUS | Status: AC
  Administered 2015-09-09: 2 g via INTRAVENOUS
  Filled 2015-09-08: qty 2

## 2015-09-08 MED ORDER — IOHEXOL 300 MG/ML  SOLN
25.0000 mL | Freq: Once | INTRAMUSCULAR | Status: AC | PRN
  Administered 2015-09-08: 25 mL via ORAL

## 2015-09-08 MED ORDER — IOHEXOL 300 MG/ML  SOLN
100.0000 mL | Freq: Once | INTRAMUSCULAR | Status: AC | PRN
  Administered 2015-09-08: 100 mL via INTRAVENOUS

## 2015-09-08 MED ORDER — ONDANSETRON HCL 4 MG/2ML IJ SOLN
4.0000 mg | Freq: Once | INTRAMUSCULAR | Status: AC
  Administered 2015-09-08: 4 mg via INTRAVENOUS
  Filled 2015-09-08: qty 2

## 2015-09-08 NOTE — ED Provider Notes (Addendum)
CSN: [id number]     Arrival date & time 09/08/15  1826 History   First MD Initiated Contact with Patient 09/08/15 2029     Chief Complaint  Patient presents with  . Urinary Incontinence  . Diarrhea  . Abdominal Cramping     (Consider location/radiation/quality/duration/timing/severity/associated sxs/prior Treatment) HPI Comments: 49yo F w/ PMH of HTN who p/w fever, diarrhea, urinary incontinence. The patient states that she has a one-year history of abdominal bloating and 4 month history of persistent, nonbloody diarrhea. She states that the diarrhea is worse every time she eats, although she has made many dietary modifications in an effort to improve the diarrhea. She was diagnosed with pneumonia 3 weeks ago and completed a 9 day course of Levaquin but she states that the diarrhea was present prior to the antibiotics. She has had urinary incontinence and fecal incontinence with the diarrhea. She reports a low-grade fever recently as well as generalized weakness and leg cramps. She also reports dark urine but she denies any dysuria. She has had an elevated white blood cell count for the past one year for which she has seen a hematologist previously. She denies any sick contacts or recent travel.  Patient is a 49 y.o. female presenting with diarrhea and cramps. The history is provided by the patient.  Diarrhea Abdominal Cramping    Past Medical History  Diagnosis Date  . Hypertension   . Family history of anesthesia complication     MY BROTHER PASSED FROM IT"  . Complication of anesthesia   . PONV (postoperative nausea and vomiting)    Past Surgical History  Procedure Laterality Date  . Rotator cuff repair    . Cholecystectomy    . Breast surgery    . Cholecystectomy N/A 06/16/2014    Procedure: LAPAROSCOPIC CHOLECYSTECTOMY ;  Surgeon: Cherylynn Ridges, MD;  Location: [facility] OR;  Service: General;  Laterality: N/A;   History reviewed. No pertinent family history. Social History   Substance Use Topics  . Smoking status: Former Smoker -- 1.50 packs/day for 30 years    Types: Cigarettes  . Smokeless tobacco: Never Used  . Alcohol Use: No   OB History    No data available     Review of Systems  Gastrointestinal: Positive for diarrhea.   10 Systems reviewed and are negative for acute change except as noted in the HPI.    Allergies  Amoxicillin; Rubbing alcohol; Hydrocodone; and Oxycodone  Home Medications   Prior to Admission medications   Medication Sig Start Date End Date Taking? Authorizing Provider  amphetamine-dextroamphetamine (ADDERALL) 20 MG tablet Take 20 mg by mouth daily.   Yes Historical Provider, MD  benzonatate (TESSALON) 200 MG capsule Take 200 mg by mouth 3 (three) times daily as needed for cough.  09/01/15  Yes Historical Provider, MD  levofloxacin (LEVAQUIN) 500 MG tablet Take 500 mg by mouth daily. 9 day course completed 08/26/15  Yes Historical Provider, MD  metoprolol succinate (TOPROL-XL) 25 MG 24 hr tablet Take 25 mg by mouth daily. 09/01/15  Yes Historical Provider, MD  ondansetron (ZOFRAN-ODT) 4 MG disintegrating tablet Take 4 mg by mouth every 8 (eight) hours as needed for nausea or vomiting.  08/26/15  Yes Historical Provider, MD  PROAIR HFA 108 (90 BASE) MCG/ACT inhaler Inhale 1-2 puffs into the lungs every 6 (six) hours as needed for wheezing or shortness of breath.  09/01/15  Yes Historical Provider, MD   BP 103/57 mmHg  Pulse 79  Temp(Src) 99.5 F (  37.5 C) (Oral)  Resp 18  Ht  (1.549 m)  Wt 140 lb (63.504 kg)  BMI 26.47 kg/m2  SpO2 96% Physical Exam  Constitutional: She is oriented to person, place, and time. She appears well-developed and well-nourished. No distress.  Tearful, anxious  HENT:  Head: Normocephalic and atraumatic.  Mouth/Throat: Oropharynx is clear and moist.  Moist mucous membranes  Eyes: Conjunctivae are normal. Pupils are equal, round, and reactive to light.  Neck: Neck supple.   Cardiovascular: Normal rate, regular rhythm and normal heart sounds.   No murmur heard. Pulmonary/Chest: Effort normal and breath sounds normal.  Abdominal: Soft. Bowel sounds are normal. She exhibits no distension. There is no tenderness.  Genitourinary: No vaginal discharge found.  No perianal or perineal abnormalities  Musculoskeletal: She exhibits no edema.  Neurological: She is alert and oriented to person, place, and time.  Fluent speech  Skin: Skin is warm and dry.  Psychiatric: Judgment normal.  anxious  Nursing note and vitals reviewed. Examination chaperoned by Kara Mead, bedside nurse.   ED Course  Procedures (including critical care time) Labs Review Labs Reviewed  CBC - Abnormal; Notable for the following:    WBC 20.9 (*)    Hemoglobin 10.9 (*)    HCT 32.7 (*)    Platelets 654 (*)    All other components within normal limits  BASIC METABOLIC PANEL - Abnormal; Notable for the following:    Sodium 132 (*)    Chloride 99 (*)    Glucose, Bld 113 (*)    All other components within normal limits  C DIFFICILE QUICK SCREEN W PCR REFLEX  URINALYSIS, ROUTINE W REFLEX MICROSCOPIC (NOT AT [facility])    Imaging Review Ct Abdomen Pelvis W Contrast  09/08/2015  CLINICAL DATA:  Diarrhea and fecal incontinence.  Low-grade fevers. EXAM: CT ABDOMEN AND PELVIS WITH CONTRAST TECHNIQUE: Multidetector CT imaging of the abdomen and pelvis was performed using the standard protocol following bolus administration of intravenous contrast. CONTRAST:  25mL OMNIPAQUE IOHEXOL 300 MG/ML SOLN, OMNIPAQUE IOHEXOL 300 MG/ML SOLN COMPARISON:  06/13/2014 FINDINGS: Lower chest: Mild nodular opacities in the left lower lobe may be infectious or inflammatory. No confluent consolidation in the lung bases. No effusions. Hepatobiliary: Cholecystectomy.  Unremarkable liver and bile ducts. Pancreas: Normal Spleen: Normal Adrenals/Urinary Tract: The adrenals and kidneys are normal in appearance. There is no  urinary calculus evident. There is no hydronephrosis or ureteral dilatation. Collecting systems and ureters appear unremarkable. Stomach/Bowel: The stomach and small bowel appear unremarkable. The appendix is normal. There is fluid content in the colon consistent with the history of diarrhea. No acute inflammatory changes are evident. There is no bowel obstruction. There is a moderately complex soft tissue abnormality at the posterior aspect of the rectum extending toward the perineum. This might represent perianal fistulae but it is not well characterized. There also is a bony defect of the lower sacrum, containing fat, consistent with a congenital dysraphic defect but relationship of this sacral anomaly to the nearby perirectal soft tissue abnormality is not conclusive. Vascular/Lymphatic: The abdominal aorta is normal in caliber. There is no atherosclerotic calcification. There is no adenopathy in the abdomen or pelvis. Reproductive: Normal uterus and ovaries. Other: No ascites. Musculoskeletal: Sacral anomaly as described above. No other significant musculoskeletal lesions. Incidentally noted small fat containing umbilical hernia. IMPRESSION: 1. Mild nodularity in the left lower lobe lung base, new from 06/13/2014. This may be infectious or inflammatory. 2. Moderately prominent perirectal soft tissue nodularity and stranding,  appearing chronic. This does not have the appearance of an acute inflammatory process. This could represent perianal fistulae - recommend correlation with physical examination. 3. Lower sacral anomaly containing fat, in close proximity to the perirectal soft tissue abnormalities. 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 Electronically Signed   By: Ellery Plunkaniel R Mitchell M.D.   On: 09/08/2015 22:33   I have personally reviewed and evaluated these lab results as part of my medical  decision-making.   EKG Interpretation None     Medications  sodium chloride 0.9 % bolus 1,000 mL (1,000 mLs Intravenous New Bag/Given 09/08/15 2126)  acetaminophen (TYLENOL) tablet 1,000 mg (1,000 mg Oral Given 09/08/15 2115)  ondansetron (ZOFRAN) injection 4 mg (4 mg Intravenous Given 09/08/15 2115)  iohexol (OMNIPAQUE) 300 MG/ML solution 25 mL (25 mLs Oral Contrast Given 09/08/15 2201)  iohexol (OMNIPAQUE) 300 MG/ML solution 100 mL (100 mLs Intravenous Contrast Given 09/08/15 2201)     MDM   Final diagnoses:  Diarrhea, unspecified type  Leukocytosis  Fever, unspecified fever cause    49 year old female who presents with one year of abdominal bloating as well as several months of nonbloody diarrhea, recent incontinence related to the diarrhea. Patient tearful and anxious at presentation. Vital signs notable for fever of 100.9. No obvious abdominal distention on exam and no focal abdominal tenderness. Gave the patient Tylenol, Zofran, and an IV fluid bolus and obtained above labs. Labwork notable for WBC 20.9, hemoglobin 10.9.  Because of the patient's fever and significant leukocytosis in addition to complaints of incontinence, obtained a CT of the abdomen and pelvis to rule out acute process.  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 I examined pt's anus and perineum and did not find any acute abnormalities corresponding with an external fistula. Consulted GI. I am concerned about the patient's fever and on re-examination, lower BPs. Pt will be admitted to general medicine for further care.  Laurence Spatesachel Morgan Kaelan Amble, MD 09/08/15 [id number]  Laurence Spatesachel Morgan Zebastian Carico, MD 09/08/15 [phone]

## 2015-09-08 NOTE — ED Notes (Signed)
Was diagnosed with PNA 3 weeks and was on Levaquin. Having diarrhea and loose for more than thee weeks.  Having low grade fever.  C/o weakness and cramps in legs.

## 2015-09-09 ENCOUNTER — Encounter (HOSPITAL_COMMUNITY): Payer: Self-pay | Admitting: Gastroenterology

## 2015-09-09 ENCOUNTER — Observation Stay (HOSPITAL_COMMUNITY): Payer: Self-pay

## 2015-09-09 DIAGNOSIS — D72829 Elevated white blood cell count, unspecified: Secondary | ICD-10-CM

## 2015-09-09 DIAGNOSIS — E860 Dehydration: Secondary | ICD-10-CM | POA: Diagnosis present

## 2015-09-09 DIAGNOSIS — R918 Other nonspecific abnormal finding of lung field: Secondary | ICD-10-CM

## 2015-09-09 DIAGNOSIS — I10 Essential (primary) hypertension: Secondary | ICD-10-CM | POA: Diagnosis present

## 2015-09-09 DIAGNOSIS — R1110 Vomiting, unspecified: Secondary | ICD-10-CM | POA: Insufficient documentation

## 2015-09-09 DIAGNOSIS — R935 Abnormal findings on diagnostic imaging of other abdominal regions, including retroperitoneum: Secondary | ICD-10-CM | POA: Diagnosis present

## 2015-09-09 DIAGNOSIS — R112 Nausea with vomiting, unspecified: Secondary | ICD-10-CM

## 2015-09-09 DIAGNOSIS — R509 Fever, unspecified: Secondary | ICD-10-CM | POA: Insufficient documentation

## 2015-09-09 DIAGNOSIS — J189 Pneumonia, unspecified organism: Secondary | ICD-10-CM | POA: Diagnosis present

## 2015-09-09 DIAGNOSIS — R197 Diarrhea, unspecified: Secondary | ICD-10-CM | POA: Diagnosis present

## 2015-09-09 LAB — CBC WITH DIFFERENTIAL/PLATELET
BASOS ABS: 0.1 10*3/uL (ref 0.0–0.1)
BASOS PCT: 0 %
EOS ABS: 0 10*3/uL (ref 0.0–0.7)
EOS PCT: 0 %
HCT: 26 % — ABNORMAL LOW (ref 36.0–46.0)
Hemoglobin: 8.4 g/dL — ABNORMAL LOW (ref 12.0–15.0)
Lymphocytes Relative: 12 %
Lymphs Abs: 2.4 10*3/uL (ref 0.7–4.0)
MCH: 25.8 pg — ABNORMAL LOW (ref 26.0–34.0)
MCHC: 32.3 g/dL (ref 30.0–36.0)
MCV: 80 fL (ref 78.0–100.0)
MONOS PCT: 10 %
Monocytes Absolute: 1.9 10*3/uL — ABNORMAL HIGH (ref 0.1–1.0)
NEUTROS ABS: 15 10*3/uL — AB (ref 1.7–7.7)
Neutrophils Relative %: 77 %
PLATELETS: 575 10*3/uL — AB (ref 150–400)
RBC: 3.25 MIL/uL — ABNORMAL LOW (ref 3.87–5.11)
RDW: 14.8 % (ref 11.5–15.5)
WBC: 19.4 10*3/uL — ABNORMAL HIGH (ref 4.0–10.5)

## 2015-09-09 LAB — FERRITIN: FERRITIN: 306 ng/mL (ref 11–307)

## 2015-09-09 LAB — BASIC METABOLIC PANEL
ANION GAP: 7 (ref 5–15)
BUN: 8 mg/dL (ref 6–20)
CALCIUM: 7.9 mg/dL — AB (ref 8.9–10.3)
CO2: 22 mmol/L (ref 22–32)
CREATININE: 0.8 mg/dL (ref 0.44–1.00)
Chloride: 109 mmol/L (ref 101–111)
Glucose, Bld: 120 mg/dL — ABNORMAL HIGH (ref 65–99)
Potassium: 3.6 mmol/L (ref 3.5–5.1)
Sodium: 138 mmol/L (ref 135–145)

## 2015-09-09 LAB — IRON AND TIBC
IRON: 15 ug/dL — AB (ref 28–170)
SATURATION RATIOS: 7 % — AB (ref 10.4–31.8)
TIBC: 221 ug/dL — ABNORMAL LOW (ref 250–450)
UIBC: 206 ug/dL

## 2015-09-09 LAB — STREP PNEUMONIAE URINARY ANTIGEN: STREP PNEUMO URINARY ANTIGEN: NEGATIVE

## 2015-09-09 LAB — EXPECTORATED SPUTUM ASSESSMENT W REFEX TO RESP CULTURE

## 2015-09-09 LAB — C DIFFICILE QUICK SCREEN W PCR REFLEX
C Diff antigen: NEGATIVE
C Diff interpretation: NEGATIVE
C Diff toxin: NEGATIVE

## 2015-09-09 LAB — I-STAT CG4 LACTIC ACID, ED: Lactic Acid, Venous: 0.57 mmol/L (ref 0.5–2.0)

## 2015-09-09 MED ORDER — ONDANSETRON 4 MG PO TBDP
4.0000 mg | ORAL_TABLET | Freq: Three times a day (TID) | ORAL | Status: DC | PRN
  Administered 2015-09-09 – 2015-09-10 (×2): 4 mg via ORAL
  Filled 2015-09-09 (×2): qty 1

## 2015-09-09 MED ORDER — SODIUM CHLORIDE 0.9 % IV SOLN
INTRAVENOUS | Status: DC
  Administered 2015-09-09 – 2015-09-11 (×4): via INTRAVENOUS

## 2015-09-09 MED ORDER — PANTOPRAZOLE SODIUM 40 MG PO TBEC
40.0000 mg | DELAYED_RELEASE_TABLET | Freq: Every day | ORAL | Status: DC
  Administered 2015-09-10 – 2015-09-11 (×2): 40 mg via ORAL
  Filled 2015-09-09 (×2): qty 1

## 2015-09-09 MED ORDER — VANCOMYCIN HCL IN DEXTROSE 750-5 MG/150ML-% IV SOLN
750.0000 mg | Freq: Two times a day (BID) | INTRAVENOUS | Status: DC
  Administered 2015-09-10 – 2015-09-11 (×2): 750 mg via INTRAVENOUS
  Filled 2015-09-09 (×6): qty 150

## 2015-09-09 MED ORDER — ALBUTEROL SULFATE (2.5 MG/3ML) 0.083% IN NEBU: 3.0000 mL | INHALATION_SOLUTION | Freq: Four times a day (QID) | RESPIRATORY_TRACT | Status: DC | PRN

## 2015-09-09 MED ORDER — DEXTROSE 5 % IV SOLN
1.0000 g | Freq: Three times a day (TID) | INTRAVENOUS | Status: DC
  Administered 2015-09-09 – 2015-09-11 (×5): 1 g via INTRAVENOUS
  Filled 2015-09-09 (×11): qty 1

## 2015-09-09 MED ORDER — PANTOPRAZOLE SODIUM 40 MG PO TBEC
40.0000 mg | DELAYED_RELEASE_TABLET | Freq: Every day | ORAL | Status: DC
  Administered 2015-09-09: 40 mg via ORAL
  Filled 2015-09-09: qty 1

## 2015-09-09 MED ORDER — ACETAMINOPHEN 325 MG PO TABS
650.0000 mg | ORAL_TABLET | Freq: Four times a day (QID) | ORAL | Status: DC | PRN
  Administered 2015-09-09 – 2015-09-11 (×6): 650 mg via ORAL
  Filled 2015-09-09 (×7): qty 2

## 2015-09-09 MED ORDER — GUAIFENESIN ER 600 MG PO TB12
1200.0000 mg | ORAL_TABLET | Freq: Two times a day (BID) | ORAL | Status: DC
  Administered 2015-09-09 – 2015-09-10 (×4): 1200 mg via ORAL
  Filled 2015-09-09 (×4): qty 2

## 2015-09-09 MED ORDER — VANCOMYCIN HCL 10 G IV SOLR
1250.0000 mg | Freq: Once | INTRAVENOUS | Status: AC
  Administered 2015-09-09: 1250 mg via INTRAVENOUS
  Filled 2015-09-09 (×2): qty 1250

## 2015-09-09 MED ORDER — ONDANSETRON 4 MG PO TBDP
4.0000 mg | ORAL_TABLET | Freq: Three times a day (TID) | ORAL | Status: DC
  Administered 2015-09-09 – 2015-09-11 (×5): 4 mg via ORAL
  Filled 2015-09-09 (×5): qty 1

## 2015-09-09 MED ORDER — DOXYCYCLINE HYCLATE 100 MG PO TABS
100.0000 mg | ORAL_TABLET | Freq: Two times a day (BID) | ORAL | Status: DC
  Administered 2015-09-09 (×2): 100 mg via ORAL
  Filled 2015-09-09 (×2): qty 1

## 2015-09-09 NOTE — H&P (Signed)
PCP:   [name], MD   Chief Complaint:  Diarrhea, cough, fever  HPI: 49 yo female with multiple complaints including diarrhea for 4 months which has been nonbloody (reports has seen PCP about this but no stool studies ever done), a month ago diagnosed with pna treated with levaquin.  Reports she has been running fever over 101 on a daily basis for over 2 weeks, and her PCP told her not to worry about a fever unless it was over 102, but it has never been over 102.  She reports over 10 episodes of diarrhea a day, but none since arrival to ED over 6 hours ago.  Reports generalized abdominal pain.  Reports coughing since her pna 3 weeks ago, this did get better with the levaquin which she completed but then over a week has worsened.  No chest pain or sob.  She has been having urinary and bowel incontinence after the coughing.  She denies significant back pain , no le weakness and no numbness anywhere.  No vomiting.  Denies smoking.  No sick contacts.  No recent travel.  No rashes.  Pt referred for admission for her diarrhea, fever and elevated wbc.  No cxr had been done, this was requested to be done in the ED which shows a round infiltrate vs mass in left lower lobe.  Review of Systems:  Positive and negative as per HPI otherwise all other systems are negative  Past Medical History: Past Medical History  Diagnosis Date  . Hypertension   . Family history of anesthesia complication     MY BROTHER PASSED FROM IT"  . Complication of anesthesia   . PONV (postoperative nausea and vomiting)    Past Surgical History  Procedure Laterality Date  . Rotator cuff repair    . Cholecystectomy    . Breast surgery    . Cholecystectomy N/A 06/16/2014    Procedure: LAPAROSCOPIC CHOLECYSTECTOMY ;  Surgeon: Cherylynn Ridges, MD;  Location: [facility] OR;  Service: General;  Laterality: N/A;    Medications: Prior to Admission medications   Medication Sig Start Date End Date Taking? Authorizing Provider   amphetamine-dextroamphetamine (ADDERALL) 20 MG tablet Take 20 mg by mouth daily.   Yes Historical Provider, MD  benzonatate (TESSALON) 200 MG capsule Take 200 mg by mouth 3 (three) times daily as needed for cough.  09/01/15  Yes Historical Provider, MD  levofloxacin (LEVAQUIN) 500 MG tablet Take 500 mg by mouth daily. 9 day course completed 08/26/15  Yes Historical Provider, MD  metoprolol succinate (TOPROL-XL) 25 MG 24 hr tablet Take 25 mg by mouth daily. 09/01/15  Yes Historical Provider, MD  ondansetron (ZOFRAN-ODT) 4 MG disintegrating tablet Take 4 mg by mouth every 8 (eight) hours as needed for nausea or vomiting.  08/26/15  Yes Historical Provider, MD  PROAIR HFA 108 (90 BASE) MCG/ACT inhaler Inhale 1-2 puffs into the lungs every 6 (six) hours as needed for wheezing or shortness of breath.  09/01/15  Yes Historical Provider, MD    Allergies:   Allergies  Allergen Reactions  . Amoxicillin Rash  . Rubbing Alcohol [Isopropyl Alcohol]   . Hydrocodone Nausea Only  . Oxycodone Nausea Only    Social History:  reports that she has quit smoking. Her smoking use included Cigarettes. She has a 45 pack-year smoking history. She has never used smokeless tobacco. She reports that she does not drink alcohol or use illicit drugs.  Family History: No premature CAD  Physical Exam: Filed Vitals:  09/08/15 1835 09/08/15 2235 09/09/15 0208  BP: 102/66 103/57 110/55  Pulse: 96 79 76  Temp: 100.9 F (38.3 C) 99.5 F (37.5 C) 98.7 F (37.1 C)  TempSrc: Oral Oral Oral  Resp: 18 18 20   Height: 5\' 1"  (1.549 m)    Weight: 63.504 kg (140 lb)    SpO2: 100% 96% 99%   General appearance: alert, cooperative and no distress Head: Normocephalic, without obvious abnormality, atraumatic Eyes: negative Nose: Nares normal. Septum midline. Mucosa normal. No drainage or sinus tenderness. Neck: no JVD and supple, symmetrical, trachea midline Lungs: clear to auscultation bilaterally Heart: regular rate  and rhythm, S1, S2 normal, no murmur, click, rub or gallop Abdomen: soft, non-tender; bowel sounds normal; no masses,  no organomegaly Extremities: extremities normal, atraumatic, no cyanosis or edema Pulses: 2+ and symmetric Skin: Skin color, texture, turgor normal. No rashes or lesions Neurologic: Grossly normal  5/5 strenght all 4.  No saddle anesthesia. Reflexes nml. No palp pain along lumbar spine, no back pain at this time Rectal deferred done by dr little reportd normal.  Labs on Admission:   Recent Labs  09/08/15 2000  NA 132*  K 4.1  CL 99*  CO2 25  GLUCOSE 113*  BUN 10  CREATININE 0.91  CALCIUM 9.0    Recent Labs  09/08/15 2000  WBC 20.9*  HGB 10.9*  HCT 32.7*  MCV 80.0  PLT 654*    Radiological Exams on Admission: Dg Chest 2 View  09/09/2015  CLINICAL DATA:  Fevers EXAM: CHEST - 2 VIEW COMPARISON:  None. FINDINGS: Cardiac shadow is within normal limits. There is a rounded area of increased density identified in the superior segment of the left lower lung lobe emanating from the left hilar region. No other focal abnormality is noted within the lungs. Bony structures are within normal limits. IMPRESSION: Changes most consistent with a round pneumonia in the left lower lobe. Followup PA and lateral chest X-ray is recommended in 3-4 weeks following trial of antibiotic therapy to ensure resolution and exclude underlying malignancy. Electronically Signed   By: Alcide CleverMark  Lukens M.D.   On: 09/09/2015 00:47   Ct Abdomen Pelvis W Contrast  09/08/2015  CLINICAL DATA:  Diarrhea and fecal incontinence.  Low-grade fevers. EXAM: CT ABDOMEN AND PELVIS WITH CONTRAST TECHNIQUE: Multidetector CT imaging of the abdomen and pelvis was performed using the standard protocol following bolus administration of intravenous contrast. CONTRAST:  25mL OMNIPAQUE IOHEXOL 300 MG/ML SOLN, 100mL OMNIPAQUE IOHEXOL 300 MG/ML SOLN COMPARISON:  06/13/2014 FINDINGS: Lower chest: Mild nodular opacities in the  left lower lobe may be infectious or inflammatory. No confluent consolidation in the lung bases. No effusions. Hepatobiliary: Cholecystectomy.  Unremarkable liver and bile ducts. Pancreas: Normal Spleen: Normal Adrenals/Urinary Tract: The adrenals and kidneys are normal in appearance. There is no urinary calculus evident. There is no hydronephrosis or ureteral dilatation. Collecting systems and ureters appear unremarkable. Stomach/Bowel: The stomach and small bowel appear unremarkable. The appendix is normal. There is fluid content in the colon consistent with the history of diarrhea. No acute inflammatory changes are evident. There is no bowel obstruction. There is a moderately complex soft tissue abnormality at the posterior aspect of the rectum extending toward the perineum. This might represent perianal fistulae but it is not well characterized. There also is a bony defect of the lower sacrum, containing fat, consistent with a congenital dysraphic defect but relationship of this sacral anomaly to the nearby perirectal soft tissue abnormality is not conclusive. Vascular/Lymphatic: The  abdominal aorta is normal in caliber. There is no atherosclerotic calcification. There is no adenopathy in the abdomen or pelvis. Reproductive: Normal uterus and ovaries. Other: No ascites. Musculoskeletal: Sacral anomaly as described above. No other significant musculoskeletal lesions. Incidentally noted small fat containing umbilical hernia. IMPRESSION: 1. Mild nodularity in the left lower lobe lung base, new from 06/13/2014. This may be infectious or inflammatory. 2. Moderately prominent perirectal soft tissue nodularity and stranding, appearing chronic. This does not have the appearance of an acute inflammatory process. This could represent perianal fistulae - recommend correlation with physical examination. 3. Lower sacral anomaly containing fat, in close proximity to the perirectal soft tissue abnormalities. 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 Electronically Signed   By: Ellery Plunk M.D.   On: 09/08/2015 22:33    Assessment/Plan  49 yo female with diarrhea x 4months, fever reported over 2 weeks, recurrent pna   Principal Problem:   Diarrhea-  For over 4 months.  Cdiff, stool cultures ordered.  However, pt has had no episodes of diarrhea since arrival at 635pm.  abd exam is benign but her ct shows questionable fistula formation perianal.  Gi has been consulted for evaluation for possible need of colonoscopy which may be able to be done as outpatient.   Active Problems:   Dehydration- ivf   Leukocytosis- noted   Abnormal computed tomography angiography (CTA) of abdomen and pelvis- noted, GI consult for potential scoping   Hypertension   Recurrent pna - pt reports she completed levaquin a month ago.  Will give doxycycline , and will also obtain afb smears and sputum cx along with ct chest to better evaluate this lesion.  Unknown where her infiltrate was last month or what her cxr looked like last month.   Urinary incontinence with coughing - noted, seems to be related to her respiratory illness.  Work up and treat respiratory illness  obs on medical.  Full code.    DAVID,RACHAL A 09/09/2015, 3:44 AM

## 2015-09-09 NOTE — Progress Notes (Signed)
ANTIBIOTIC CONSULT NOTE - INITIAL  Pharmacy Consult for vancomycin and fortaz Indication: pneumonia  Allergies  Allergen Reactions  . Amoxicillin Rash  . Rubbing Alcohol [Isopropyl Alcohol]   . Hydrocodone Nausea Only  . Oxycodone Nausea Only    Patient Measurements: Height: 5\' 1"  (154.9 cm) Weight: 140 lb (63.504 kg) IBW/kg (Calculated) : 47.8   Vital Signs: Temp: 100.8 F (38.2 C) (11/01 0751) Temp Source: Oral (11/01 0751) BP: 107/54 mmHg (11/01 0651) Pulse Rate: 83 (11/01 0651) Intake/Output from previous day: 10/31 0701 - 11/01 0700 In: 370 [I.V.:370] Out: 50 [Urine:50] Intake/Output from this shift:    Labs:  Recent Labs  09/08/15 2000 09/09/15 0635  WBC 20.9* 19.4*  HGB 10.9* 8.4*  PLT 654* 575*  CREATININE 0.91 0.80   Estimated Creatinine Clearance: 74.2 mL/min (by C-G formula based on Cr of 0.8). No results for input(s): VANCOTROUGH, VANCOPEAK, VANCORANDOM, GENTTROUGH, GENTPEAK, GENTRANDOM, TOBRATROUGH, TOBRAPEAK, TOBRARND, AMIKACINPEAK, AMIKACINTROU, AMIKACIN in the last 72 hours.   Microbiology: Recent Results (from the past 720 hour(s))  Culture, blood (routine x 2)     Status: None (Preliminary result)   Collection Time: 09/08/15 11:46 PM  Result Value Ref Range Status   Specimen Description BLOOD RIGHT ANTECUBITAL  Final   Special Requests BOTTLES DRAWN AEROBIC AND ANAEROBIC 10CC EACH  Final   Culture NO GROWTH < 12 HOURS  Final   Report Status PENDING  Incomplete  Culture, blood (routine x 2)     Status: None (Preliminary result)   Collection Time: 09/09/15 12:01 AM  Result Value Ref Range Status   Specimen Description BLOOD RIGHT HAND  Final   Special Requests BOTTLES DRAWN AEROBIC AND ANAEROBIC 6CC EACH  Final   Culture NO GROWTH < 12 HOURS  Final   Report Status PENDING  Incomplete  Culture, sputum-assessment     Status: None   Collection Time: 09/09/15  4:15 AM  Result Value Ref Range Status   Specimen Description SPUTUM EXPECTORATED   Final   Special Requests NONE  Final   Sputum evaluation   Final    MICROSCOPIC FINDINGS SUGGEST THAT THIS SPECIMEN IS NOT REPRESENTATIVE OF LOWER RESPIRATORY SECRETIONS. PLEASE RECOLLECT. Results Called to: COFFEE,J. AT 1210 ON 09/09/2015 BY BAUGHAM,M. Performed at [facility]    Report Status 09/09/2015 FINAL  Final  C difficile quick scan w PCR reflex     Status: None   Collection Time: 09/09/15  7:30 AM  Result Value Ref Range Status   C Diff antigen NEGATIVE NEGATIVE Final   C Diff toxin NEGATIVE NEGATIVE Final   C Diff interpretation Negative for toxigenic C. difficile  Final    Medical History: Past Medical History  Diagnosis Date  . Hypertension   . Family history of anesthesia complication     MY BROTHER PASSED FROM IT"  . Complication of anesthesia   . PONV (postoperative nausea and vomiting)   . GERD (gastroesophageal reflux disease)   . Leukocytosis     Medications:  Prescriptions prior to admission  Medication Sig Dispense Refill Last Dose  . amphetamine-dextroamphetamine (ADDERALL) 20 MG tablet Take 20 mg by mouth daily.   Past Month at Unknown time  . benzonatate (TESSALON) 200 MG capsule Take 200 mg by mouth 3 (three) times daily as needed for cough.   0 Past Week at Unknown time  . levofloxacin (LEVAQUIN) 500 MG tablet Take 500 mg by mouth daily. 9 day course completed  0 Past Week at Unknown time  .  metoprolol succinate (TOPROL-XL) 25 MG 24 hr tablet Take 25 mg by mouth daily.  1 Past Week at Unknown time  . ondansetron (ZOFRAN-ODT) 4 MG disintegrating tablet Take 4 mg by mouth every 8 (eight) hours as needed for nausea or vomiting.    Past Week at Unknown time  . PROAIR HFA 108 (90 BASE) MCG/ACT inhaler Inhale 1-2 puffs into the lungs every 6 (six) hours as needed for wheezing or shortness of breath.   0 Past Week at Unknown time   Assessment: 49 yo lady to start broad spectrum antibiotics for PNA.  She reports she completed a course of levaquin ~1  month ago.    Goal of Therapy:  Vancomycin trough level 15-20 mcg/ml  Plan:  Vancomycin 1250 mg IV X 1 then 750 mg IV q12 hours Fortaz 1 gm IV q8 hours F/u renal function, cultures, and clinical course Check vanc trough when appropriate.  Thanks for allowing pharmacy to be a part of this patient's care.  Talbert Cage, PharmD Clinical Pharmacist 09/09/2015,1:52 PM

## 2015-09-09 NOTE — Progress Notes (Signed)
PROGRESS NOTE  Jamie Flowers XBJ:[id number] DOB: [birth year] DOA: 09/08/2015 PCP: Trinna PostKOBERLEIN, [name], MD  Summary: Jamie Flowers yof with PMH of recent pneumonia one month ago presented with complaints of four month consistent diarrhea, mild fever and cough. Patient noted to have completed a course of Levaquin for pneumonia. During initial workup in the ED patient found to have elevated WBC and fever. Admitted for possible recurrent pneumonia and diarrhea.    Assessment/Plan: 1. Diarrhea. Etiology unclear, C. diff negative. GI plans upper/lower endoscopy. 2. Recurrent pneumonia. Noted to have completed a course of Levaquin 3 weeks ago.  No risk factors for TB. CT without findings to suggest TB. Will d/c AFB smears. 3. Lung mass suspicious for malignancy. Have not yet discussed with patient. 4. Prominent perirectal soft tissue nodularity, could represent perianal fistulae 5. 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 6. Dehydration secondary to diarrhea. Continue IVF.  7. HTN. Stable 8. Hyponatremia, secondary to diarrhea. Resolved. 9. Tobacco use disorder in remission   Appears ill but stable. Plan continue IV abx, further evaluation per GI.  CT findings are new since 2015 and worrisome for malignancy.   Code Status: FULL DVT prophylaxis: SCDs Family Communication: son at bedside Disposition Plan: home  Brendia Sacksaniel Eero Dini, MD  [facility]  Pager [phone] If 7PM-7AM, please contact night-coverage at [email or web address], password [email or web address] 09/09/2015, 7:28 AM    Consultants:    Procedures:    Antibiotics:  Doxycycline 11/1  Cefepime 11/1 >>  Vancomycin 11/1 >>  HPI/Subjective: Ongoing diarrhea. No abdominal pain, some cramping at time. Coughing. No SOB.  Objective: Filed Vitals:   09/08/15 1835 09/08/15 2235 09/09/15 0208 09/09/15 0651  BP: 102/66 103/57 110/55 107/54  Pulse: 96 79 76 83  Temp: 100.9 F (38.3 C) 99.5 F (37.5 C) 98.7 F (37.1 C) 100.4 F (38 C)   TempSrc: Oral Oral Oral Oral  Resp: 18 18 20 20   Height: 5\' 1"  (1.549 m)     Weight: 63.504 kg (140 lb)     SpO2: 100% 96% 99% 99%    Intake/Output Summary (Last 24 hours) at 09/09/15 0728 Last data filed at 09/09/15 0600  Gross per 24 hour  Intake    370 ml  Output     50 ml  Net    320 ml     Filed Weights   09/08/15 1835  Weight: 63.504 kg (140 lb)    Exam:  Mild temp 100.4, VSS not hypoxic  General:  Appears calm, ill but not toxic. Appears mildly uncomfortable. Cardiovascular: RRR, no m/r/g. No LE edema. Respiratory: CTA bilaterally, no w/r/r. Normal respiratory effort. Abdomen: soft, ntnd Psychiatric: grossly normal mood and affect, speech fluent and appropriate  New data reviewed:  BMP unremarkable.  WBC 19.4  Hgb 8.4  CT chest  cdiff negative  Pertinent data since admission:  CXR Changes most consistent with a round pneumonia in the left lower lobe. Followup PA and lateral chest X-ray is recommended in 3-4 weeks following trial of antibiotic therapy to ensure resolution and exclude underlying malignancy.  CT A/P 1. Mild nodularity in the left lower lobe lung base, new from 06/13/2014. This may be infectious or inflammatory. 2. Moderately prominent perirectal soft tissue nodularity and stranding, appearing chronic. This does not have the appearance of an acute inflammatory process. This could represent perianal fistulae - recommend correlation with physical examination. 3. Lower sacral anomaly containing fat, in close proximity to the perirectal soft tissue abnormalities. 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  Pending data:  BC  UC  Scheduled Meds: . doxycycline  100 mg Oral Q12H   Continuous Infusions: . sodium chloride 100 mL/hr at 09/09/15 0600    Principal Problem:   Diarrhea Active Problems:   Dehydration    Leukocytosis   Abnormal computed tomography angiography (CTA) of abdomen and pelvis   Hypertension   Time spent 25 minutes   By signing my name below, I, [name] attest that this documentation has been prepared under the direction and in the presence of Brendia Sacks, MD Electronically signed: [name]  09/09/2015   I personally performed the services described in this documentation. All medical record entries made by the scribe were at my direction. I have reviewed the chart and agree that the record reflects my personal performance and is accurate and complete. Brendia Sacks, MD

## 2015-09-09 NOTE — Progress Notes (Signed)
Will discontinue airborne precautions per Dr. Irene LimboGoodrich verbal order. TB is not suspected.

## 2015-09-09 NOTE — Consult Note (Signed)
Referring Provider: Dr. Onalee Hua  Primary Care Physician:  [name], MD Primary Gastroenterologist:  Dr. Darrick Penna   Date of Admission: 09/08/15 Date of Consultation: 09/09/15  Reason for Consultation: Diarrhea   HPI:  Jamie Flowers is a 49 y.o. year old female admitted with leukocytosis, concern for recurrent pneumonia, and diarrhea. Febrile on admission. GI consultation due to diarrhea.   Diarrhea started after cholecystectomy, performed Aug 2015 by Dr. Derrell Lolling. Prior to this would have a formed BM every day. After cholecystectomy, started having loose stools. States came to the hospital because tired of living in diapers. Fecal incontinence. Worsened over time. Tried changing her diet. Diarrhea depending on what she eats. Postprandial. Notes as soon as eating, has to have diarrhea. Vegetables go right through her. Can't eat macaroni and cheese, anything with butter. Will have more consistency to it if eating baked tilapia and potatoes. No rectal bleeding. Abdominal discomfort chronically. Associated abdominal pain and cramping with diarrhea. Notes "all the time" nausea/vomiting. Stopped drinking coffee, coke, drinks pedialyte. Nausea exacerbated by eating. Historical GERD symptoms but better with dietary modification. No PPI. Notes globus sensation. Has to clear her throat. Feels like liquid sitting in cervical area. Used to weigh 167 about "a month ago" per patient. Feels like symptoms have gotten worse over last 4 weeks. Given levaquin for antibiotics about 3 weeks ago. Reports at times 10 episodes of diarrhea a day. Improved since admission. Diarrhea is green, orange. Has well water. Has been taking a lot of Ibuprofen, Advil, for back pain. Notes fever "all the time". Tmax in 101 range, never over 102. States episodes of fever chronically. States she saw an oncologist and was "cleared". Chronic leukocytosis. States stool was black today after drinking contrast. Looked like black seaweed.    No prior colonoscopy or EGD.   Past Medical History  Diagnosis Date  . Hypertension   . Family history of anesthesia complication     MY BROTHER PASSED FROM IT"  . Complication of anesthesia   . PONV (postoperative nausea and vomiting)   . GERD (gastroesophageal reflux disease)   . Leukocytosis     Past Surgical History  Procedure Laterality Date  . Rotator cuff repair    . Breast surgery    . Cholecystectomy N/A 06/16/2014    Dr. Derrell Lolling     Prior to Admission medications   Medication Sig Start Date End Date Taking? Authorizing Provider  amphetamine-dextroamphetamine (ADDERALL) 20 MG tablet Take 20 mg by mouth daily.   Yes Historical Provider, MD  benzonatate (TESSALON) 200 MG capsule Take 200 mg by mouth 3 (three) times daily as needed for cough.  09/01/15  Yes Historical Provider, MD  levofloxacin (LEVAQUIN) 500 MG tablet Take 500 mg by mouth daily. 9 day course completed 08/26/15  Yes Historical Provider, MD  metoprolol succinate (TOPROL-XL) 25 MG 24 hr tablet Take 25 mg by mouth daily. 09/01/15  Yes Historical Provider, MD  ondansetron (ZOFRAN-ODT) 4 MG disintegrating tablet Take 4 mg by mouth every 8 (eight) hours as needed for nausea or vomiting.  08/26/15  Yes Historical Provider, MD  PROAIR HFA 108 (90 BASE) MCG/ACT inhaler Inhale 1-2 puffs into the lungs every 6 (six) hours as needed for wheezing or shortness of breath.  09/01/15  Yes Historical Provider, MD    Current Facility-Administered Medications  Medication Dose Route Frequency Provider Last Rate Last Dose  . 0.9 %  sodium chloride infusion   Intravenous Continuous Haydee Monica, MD 100 mL/hr  at 09/09/15 1231    . acetaminophen (TYLENOL) tablet 650 mg  650 mg Oral Q6H PRN Standley Brooking, MD   650 mg at 09/09/15 0904  . albuterol (PROVENTIL) (2.5 MG/3ML) 0.083% nebulizer solution 3 mL  3 mL Inhalation Q6H PRN Haydee Monica, MD      . doxycycline (VIBRA-TABS) tablet 100 mg  100 mg Oral Q12H Haydee Monica, MD    100 mg at 09/09/15 0904  . ondansetron (ZOFRAN-ODT) disintegrating tablet 4 mg  4 mg Oral Q8H PRN Standley Brooking, MD   4 mg at 09/09/15 1610    Allergies as of 09/08/2015 - Review Complete 09/08/2015  Allergen Reaction Noted  . Amoxicillin Rash 09/08/2015  . Rubbing alcohol [isopropyl alcohol]  06/10/2014  . Hydrocodone Nausea Only 09/08/2015  . Oxycodone Nausea Only 09/08/2015    Family History  Problem Relation Age of Onset  . Colon cancer Neg Hx   . Lung cancer Brother     Social History   Social History  . Marital Status: Divorced    Spouse Name: N/A  . Number of Children: N/A  . Years of Education: N/A   Occupational History  . Worker's comp     shoulder injury   Social History Main Topics  . Smoking status: Former Smoker -- 1.50 packs/day for 30 years    Types: Cigarettes  . Smokeless tobacco: Never Used  . Alcohol Use: No  . Drug Use: No  . Sexual Activity: Not on file   Other Topics Concern  . Not on file   Social History Narrative    Review of Systems: Gen: see HPI, +night sweats  CV: Denies chest pain, heart palpitations, syncope, edema  Resp: +cough but improved after levaquin  GI: see HPI  GU : +urinary incontinence  MS: Denies joint pain,swelling, cramping Derm: Denies rash, itching, dry skin Psych: +depression  Heme: see HPI   Physical Exam: Vital signs in last 24 hours: Temp:  [98.7 F (37.1 C)-100.9 F (38.3 C)] 100.8 F (38.2 C) (11/01 0751) Pulse Rate:  [76-96] 83 (11/01 0651) Resp:  [18-20] 20 (11/01 0651) BP: (102-110)/(54-66) 107/54 mmHg (11/01 0651) SpO2:  [96 %-100 %] 99 % (11/01 0651) Weight:  [140 lb (63.504 kg)] 140 lb (63.504 kg) (10/31 1835) Last BM Date: 09/09/15 General:   Alert, appears weak Head:  Normocephalic and atraumatic. Eyes:  Sclera clear, no icterus.   Conjunctiva pink. Ears:  Normal auditory acuity. Nose:  No deformity, discharge,  or lesions. Mouth:  No deformity or lesions, dentition  normal. Lungs:  Clear throughout to auscultation.    Heart:  Regular rate and rhythm Abdomen:  Soft, mild discomfort lower abdomen with palpation and nondistended. No masses, hepatosplenomegaly or hernias noted. Normal bowel sounds, without guarding, and without rebound.   Rectal:  Deferred until time of colonoscopy.   Msk:  Symmetrical without gross deformities. Normal posture. Extremities:  Without  edema. Neurologic:  Alert and  oriented x4;  grossly normal neurologically. Skin:  Intact without significant lesions or rashes. Psych:  Alert and cooperative. Normal mood and affect.  Intake/Output from previous day: 10/31 0701 - 11/01 0700 In: 370 [I.V.:370] Out: 50 [Urine:50] Intake/Output this shift:    Lab Results:  Recent Labs  09/08/15 2000 09/09/15 0635  WBC 20.9* 19.4*  HGB 10.9* 8.4*  HCT 32.7* 26.0*  PLT 654* 575*   BMET  Recent Labs  09/08/15 2000 09/09/15 0635  NA 132* 138  K 4.1 3.6  CL 99* 109  CO2 25 22  GLUCOSE 113* 120*  BUN 10 8  CREATININE 0.91 0.80  CALCIUM 9.0 7.9*    Studies/Results: Dg Chest 2 View  09/09/2015  CLINICAL DATA:  Fevers EXAM: CHEST - 2 VIEW COMPARISON:  None. FINDINGS: Cardiac shadow is within normal limits. There is a rounded area of increased density identified in the superior segment of the left lower lung lobe emanating from the left hilar region. No other focal abnormality is noted within the lungs. Bony structures are within normal limits. IMPRESSION: Changes most consistent with a round pneumonia in the left lower lobe. Followup PA and lateral chest X-ray is recommended in 3-4 weeks following trial of antibiotic therapy to ensure resolution and exclude underlying malignancy. Electronically Signed   By: Alcide Clever M.D.   On: 09/09/2015 00:47   Ct Chest Wo Contrast  09/09/2015  CLINICAL DATA:  49 year old female with cough for several months and fever. Ex-smoker. EXAM: CT CHEST WITHOUT CONTRAST TECHNIQUE: Multidetector CT  imaging of the chest was performed following the standard protocol without IV contrast. COMPARISON:  06/16/2014 chest radiograph and 06/13/2014 chest CT FINDINGS: Mediastinum/Nodes: Enlarged left hilar and mediastinal lymph nodes are identified with the following index nodes: A 1.1 cm right peritracheal node (image 18) A 2 cm sub carinal node (image 24) An approximately 2.5 cm left hilar node/mass (image 26). There is no evidence of pericardial effusion. The heart and great vessels are within normal limits. Lungs/Pleura: A 3.5 x 4.3 cm mass like opacity within the superior segment left lower lobe with adjacent nodular and ground-glass opacities noted. This is suspicious for malignancy given the new enlarged left hilar and mediastinal lymph nodes. Adjacent nodular and ground-glass opacities within the left lower lobe may represent adjacent tumor or pneumonitis/pneumonia. A new 3 mm left upper lobe nodule (image 13) is noted. There is no evidence of pleural effusion or pneumothorax. Upper abdomen: Patient is status post cholecystectomy. No other significant abnormalities identified. Musculoskeletal: No acute or suspicious abnormalities are identified. IMPRESSION: 3.5 x 4.3 cm masslike opacity within the superior segment left lower lobe with enlarged mediastinal and left hilar lymph nodes - suspicious for malignancy and lymphatic spread. Adjacent nodular and ground-glass opacities may represent neoplastic spread versus pneumonitis/pneumonia. New nonspecific 3 mm left upper lobe nodule. Electronically Signed   By: Harmon Pier M.D.   On: 09/09/2015 11:22   Ct Abdomen Pelvis W Contrast  09/08/2015  CLINICAL DATA:  Diarrhea and fecal incontinence.  Low-grade fevers. EXAM: CT ABDOMEN AND PELVIS WITH CONTRAST TECHNIQUE: Multidetector CT imaging of the abdomen and pelvis was performed using the standard protocol following bolus administration of intravenous contrast. CONTRAST:  25mL OMNIPAQUE IOHEXOL 300 MG/ML SOLN,  OMNIPAQUE IOHEXOL 300 MG/ML SOLN COMPARISON:  06/13/2014 FINDINGS: Lower chest: Mild nodular opacities in the left lower lobe may be infectious or inflammatory. No confluent consolidation in the lung bases. No effusions. Hepatobiliary: Cholecystectomy.  Unremarkable liver and bile ducts. Pancreas: Normal Spleen: Normal Adrenals/Urinary Tract: The adrenals and kidneys are normal in appearance. There is no urinary calculus evident. There is no hydronephrosis or ureteral dilatation. Collecting systems and ureters appear unremarkable. Stomach/Bowel: The stomach and small bowel appear unremarkable. The appendix is normal. There is fluid content in the colon consistent with the history of diarrhea. No acute inflammatory changes are evident. There is no bowel obstruction. There is a moderately complex soft tissue abnormality at the posterior aspect of the rectum extending toward the perineum. This might represent  perianal fistulae but it is not well characterized. There also is a bony defect of the lower sacrum, containing fat, consistent with a congenital dysraphic defect but relationship of this sacral anomaly to the nearby perirectal soft tissue abnormality is not conclusive. Vascular/Lymphatic: The abdominal aorta is normal in caliber. There is no atherosclerotic calcification. There is no adenopathy in the abdomen or pelvis. Reproductive: Normal uterus and ovaries. Other: No ascites. Musculoskeletal: Sacral anomaly as described above. No other significant musculoskeletal lesions. Incidentally noted small fat containing umbilical hernia. IMPRESSION: 1. Mild nodularity in the left lower lobe lung base, new from 06/13/2014. This may be infectious or inflammatory. 2. Moderately prominent perirectal soft tissue nodularity and stranding, appearing chronic. This does not have the appearance of an acute inflammatory process. This could represent perianal fistulae - recommend correlation with physical examination. 3. Lower  sacral anomaly containing fat, in close proximity to the perirectal soft tissue abnormalities. 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 Electronically Signed   By: Ellery Plunkaniel R Mitchell M.D.   On: 09/08/2015 22:33    Impression: 49 year old female admitted with concern for pneumonia, fever, leukocytosis, and acute on chronic non-bloody diarrhea with fecal incontinence. Per report, diarrhea is chronic, starting after cholecystectomy. Could have history of bile salt diarrhea, +/- IBS overlay, but recent antibiotic exposure raises concern for Cdiff. Unable to rule out other infectious process, occult malignancy, evolving IBD, microscopic colitis. Currently, stool studies are pending at time of consultation. CT abd/pelvis without acute inflammation of colon but there is soft tissue abnormality at posterior aspect of rectum, extending toward the perineum, appearing chronic Differential raised for perianal fistula. Rectal exam in ED normal per documentation. Would benefit from direct visualization via colonoscopy once infectious colitis ruled out, along with random colonic biopsies. Await stool studies.   N/V: query uncontrolled GERD, gastritis, unable to exclude PUD in setting of routine NSAIDs. Would recommend EGD at time of colonoscopy, especially if any evidence of IDA.   Anemia: without hematochezia but reports possible black stool (diarrhea) this morning, question if true melena. Hgb normal at 14 a year ago when admitted, with admitting Hgb 10.9 and now a drift to 8 range likely multifactorial. Unknown hemoccult status. Obtain iron studies now. May need to revisit hematology evaluation in future.   Leukocytosis: per attending  Abnormal CT chest: left lobe with mass-like opacity, concerning for malignancy. Per attending.   Plan: Follow-up on pending stool studies Check iron studies Add Zofran  scheduled Add PPI once each morning Colonoscopy with random colonic biopsies +/- EGD once infectious process ruled out Low residue diet Will continue to follow with you   Nira RetortAnna W. Sams, ANP-BC [facility]         09/09/2015, 12:42 PM

## 2015-09-09 NOTE — Progress Notes (Signed)
First sputum sample not good enough. Sputum cup given to patient and we discussed what is needed for the sample.

## 2015-09-09 NOTE — Progress Notes (Signed)
Patient complaining of headache and nausea and vomiting. No prn medication ordered. Physician paged.

## 2015-09-10 ENCOUNTER — Encounter (HOSPITAL_COMMUNITY): Payer: Self-pay | Admitting: *Deleted

## 2015-09-10 ENCOUNTER — Encounter (HOSPITAL_COMMUNITY)
Admission: EM | Disposition: A | Discharge status: Home / Self Care | Payer: Self-pay | Source: Home / Self Care | Attending: Internal Medicine

## 2015-09-10 DIAGNOSIS — J189 Pneumonia, unspecified organism: Secondary | ICD-10-CM

## 2015-09-10 DIAGNOSIS — I10 Essential (primary) hypertension: Secondary | ICD-10-CM

## 2015-09-10 DIAGNOSIS — K2970 Gastritis, unspecified, without bleeding: Secondary | ICD-10-CM

## 2015-09-10 HISTORY — PX: ESOPHAGOGASTRODUODENOSCOPY: SHX5428

## 2015-09-10 HISTORY — PX: COLONOSCOPY: SHX5424

## 2015-09-10 LAB — LEGIONELLA PNEUMOPHILA SEROGP 1 UR AG: L. pneumophila Serogp 1 Ur Ag: NEGATIVE

## 2015-09-10 LAB — URINE CULTURE: Culture: NO GROWTH

## 2015-09-10 LAB — BASIC METABOLIC PANEL
Anion gap: 6 (ref 5–15)
BUN: 6 mg/dL (ref 6–20)
CALCIUM: 8.2 mg/dL — AB (ref 8.9–10.3)
CO2: 21 mmol/L — AB (ref 22–32)
Chloride: 109 mmol/L (ref 101–111)
Creatinine, Ser: 0.79 mg/dL (ref 0.44–1.00)
GFR calc Af Amer: >60 mL/min (ref >60)
GLUCOSE: 121 mg/dL — AB (ref 65–99)
Potassium: 3.4 mmol/L — ABNORMAL LOW (ref 3.5–5.1)
Sodium: 136 mmol/L (ref 135–145)

## 2015-09-10 LAB — CBC
HEMATOCRIT: 26.1 % — AB (ref 36.0–46.0)
Hemoglobin: 8.4 g/dL — ABNORMAL LOW (ref 12.0–15.0)
MCH: 25.7 pg — AB (ref 26.0–34.0)
MCHC: 32.2 g/dL (ref 30.0–36.0)
MCV: 79.8 fL (ref 78.0–100.0)
Platelets: 578 10*3/uL — ABNORMAL HIGH (ref 150–400)
RBC: 3.27 MIL/uL — ABNORMAL LOW (ref 3.87–5.11)
RDW: 14.9 % (ref 11.5–15.5)
WBC: 20.8 10*3/uL — ABNORMAL HIGH (ref 4.0–10.5)

## 2015-09-10 LAB — EXPECTORATED SPUTUM ASSESSMENT W REFEX TO RESP CULTURE

## 2015-09-10 LAB — EXPECTORATED SPUTUM ASSESSMENT W GRAM STAIN, RFLX TO RESP C

## 2015-09-10 LAB — HIV ANTIBODY (ROUTINE TESTING W REFLEX): HIV Screen 4th Generation wRfx: NONREACTIVE

## 2015-09-10 SURGERY — COLONOSCOPY
Anesthesia: Moderate Sedation

## 2015-09-10 MED ORDER — PEG 3350-KCL-NA BICARB-NACL 420 G PO SOLR
2000.0000 mL | Freq: Once | ORAL | Status: DC
Start: 2015-09-10 — End: 2015-09-11
  Filled 2015-09-10: qty 4000

## 2015-09-10 MED ORDER — STERILE WATER FOR IRRIGATION IR SOLN
Status: DC | PRN
  Administered 2015-09-10: 14:00:00

## 2015-09-10 MED ORDER — SODIUM CHLORIDE 0.9 % IV SOLN
INTRAVENOUS | Status: DC
  Administered 2015-09-10: 14:00:00 via INTRAVENOUS

## 2015-09-10 MED ORDER — LIDOCAINE VISCOUS 2 % MT SOLN
OROMUCOSAL | Status: DC | PRN
  Administered 2015-09-10: 4 mL via OROMUCOSAL

## 2015-09-10 MED ORDER — ZOLPIDEM TARTRATE 5 MG PO TABS
5.0000 mg | ORAL_TABLET | Freq: Every evening | ORAL | Status: DC | PRN
  Administered 2015-09-10: 5 mg via ORAL
  Filled 2015-09-10: qty 1

## 2015-09-10 MED ORDER — MEPERIDINE HCL 100 MG/ML IJ SOLN
INTRAMUSCULAR | Status: AC
  Filled 2015-09-10: qty 2

## 2015-09-10 MED ORDER — SODIUM CHLORIDE 0.9 % IJ SOLN
INTRAMUSCULAR | Status: AC
  Filled 2015-09-10: qty 3

## 2015-09-10 MED ORDER — MEPERIDINE HCL 100 MG/ML IJ SOLN
INTRAMUSCULAR | Status: DC | PRN
Start: 2015-09-10 — End: 2015-09-10
  Administered 2015-09-10: 50 mg via INTRAVENOUS
  Administered 2015-09-10: 25 mg via INTRAVENOUS

## 2015-09-10 MED ORDER — MIDAZOLAM HCL 5 MG/5ML IJ SOLN
INTRAMUSCULAR | Status: AC
Start: 2015-09-10 — End: 2015-09-11
  Filled 2015-09-10: qty 10

## 2015-09-10 MED ORDER — MIDAZOLAM HCL 5 MG/5ML IJ SOLN
INTRAMUSCULAR | Status: DC | PRN
  Administered 2015-09-10: 1 mg via INTRAVENOUS
  Administered 2015-09-10 (×2): 2 mg via INTRAVENOUS
  Administered 2015-09-10: 1 mg via INTRAVENOUS

## 2015-09-10 MED ORDER — PROMETHAZINE HCL 25 MG/ML IJ SOLN
INTRAMUSCULAR | Status: AC
  Filled 2015-09-10: qty 1

## 2015-09-10 MED ORDER — PROMETHAZINE HCL 25 MG/ML IJ SOLN
INTRAMUSCULAR | Status: DC | PRN
  Administered 2015-09-10: 12.5 mg via INTRAVENOUS

## 2015-09-10 MED ORDER — LIDOCAINE VISCOUS 2 % MT SOLN
OROMUCOSAL | Status: AC
  Filled 2015-09-10: qty 15

## 2015-09-10 NOTE — Op Note (Signed)
[facility] [address] [place], [id number]   COLONOSCOPY PROCEDURE REPORT  PATIENT: Jamie Flowers, Jamie Flowers  MR#: [id number] BIRTHDATE: 15-Sep-1966 , 48  yrs. old GENDER: female ENDOSCOPIST: West BaliSandi L Fields, MD REFERRED MW:[id number]:Junell [name], M.D. PROCEDURE DATE:  09/10/2015 PROCEDURE:   Colonoscopy with biopsy INDICATIONS:unexplained diarrhea.  TTG IgA, IgA-PENDING MEDICATIONS: Demerol 50 mg IV and Versed 4 mg IV  DESCRIPTION OF PROCEDURE:    Physical exam was performed.  Informed consent was obtained from the patient after explaining the benefits, risks, and alternatives to procedure.  The patient was connected to monitor and placed in left lateral position. Continuous oxygen was provided by nasal cannula and IV medicine administered through an indwelling cannula.  After administration of sedation and rectal exam, the patients rectum was intubated and the EC-[id number] ([id number]  colonoscope was advanced under direct visualization to the ileum.  The scope was removed slowly by carefully examining the color, texture, anatomy, and integrity mucosa on the way out.  The patient was recovered in endoscopy and discharged home in satisfactory condition. Estimated blood loss is zero unless otherwise noted in this procedure report.     COLON FINDINGS: The examined terminal ileum appeared to be normal. , A normal appearing cecum, ileocecal valve, and appendiceal orifice were identified.  The ascending, transverse, descending, sigmoid colon, and rectum appeared unremarkable.  Multiple biopsies were performed using cold forceps.  , and MILD LOSS OF VASCULARITY IN THE RECTUM.  COLD FORCEPS BIOPSIES OBTAINED.  PREP QUALITY: excellent.  CECAL W/D TIME: 14      minutes COMPLICATIONS: None  ENDOSCOPIC IMPRESSION: 1.   NO SOURCE FOR DIARRHEA IDENTIFIED 2.   MILD PROCTITIS  RECOMMENDATIONS: AWAIT BIOPSY LOW FAT/LACTOSE FREE DIET BENTYL QAC  TID    _______________________________ eSignedWest [place]:  Sandi L Fields, MD 09/10/2015 3:09 PM   CPT CODES: ICD CODES:  The ICD and CPT codes recommended by this software are interpretations from the data that the clinical staff has captured with the software.  The verification of the translation of this report to the ICD and CPT codes and modifiers is the sole responsibility of the health care institution and practicing physician where this report was generated.  [employer] will not be held responsible for the validity of the ICD and CPT codes included on this report.  AMA assumes no liability for data contained or not contained herein. CPT is a Publishing rights managerregistered trademark of the [employer].

## 2015-09-10 NOTE — Care Management Note (Signed)
Case Management Note  Patient Details  Name: Jamie Flowers MRN: [id number] Date of Birth: May 12, 1966  Subjective/Objective:                  Pt admitted from home with diarrhea. Pt lives with family and will return home at discharge. Pt is independent with ADL's.   Action/Plan: Financial counselor aware of self pay status. Pt has PCP. ? Need MATCH voucher at discharge. Will continue to follow for discharge planning needs.  Expected Discharge Date:                  Expected Discharge Plan:  Home/Self Care  In-House Referral:  Financial Counselor  Discharge planning Services  CM Consult  Post Acute Care Choice:  NA Choice offered to:  NA  DME Arranged:    DME Agency:     HH Arranged:    HH Agency:     Status of Service:  Completed, signed off  Medicare Important Message Given:    Date Medicare IM Given:    Medicare IM give by:    Date Additional Medicare IM Given:    Additional Medicare Important Message give by:     If discussed at Long Length of Stay Meetings, dates discussed:    Additional Comments:  Cheryl FlashBlackwell, Quinetta Shilling Crowder, RN 09/10/2015, 2:05 PM

## 2015-09-10 NOTE — Progress Notes (Signed)
Tap water enema given with light brown water returned. Pt. Refuses to drink all of Golytely. States "I'm sick!"

## 2015-09-10 NOTE — Progress Notes (Signed)
[facility] PROGRESS NOTE  Ahmia Colford ONG:[id number] DOB: May 10, 1966 DOA: 09/08/2015 PCP: [name], MD  Assessment/Plan: Diarrhea -EGD/Colonoscopy unable to identify source. -Biopsies performed.  Recurrent Pneumoniae -Continue IV abx. -Cx data negative to date.  Lung Mass -Highly suspicious for malignancy. -Will need to discuss results with patient once she awakens from meds given for EGD/colonoscopy. -Will likely need to treat her PNA and repeat CT scan in 6 weeks.  HTN -Well controlled.  Hyponatremia -Resolved.  Tobacco Abuse -Counseled on cessation.  Code Status: Full Code Family Communication: Patient only  Disposition Plan: home when ready   Consultants:  GI   Antibiotics:  Vanc  Fortaz   Subjective: Very groggy and lethargic post-colonoscopy presumably from meds administered for GI procedures.  Objective: Filed Vitals:   09/10/15 1430 09/10/15 1435 09/10/15 1440 09/10/15 1445  BP: 84/49 86/41 100/59 110/54  Pulse: 85 85  102  Temp:      TempSrc:      Resp: 42 34 30 43  Height:      Weight:      SpO2: 100% 100%  100%    Intake/Output Summary (Last 24 hours) at 09/10/15 1753 Last data filed at 09/10/15 1448  Gross per 24 hour  Intake 1701.67 ml  Output      0 ml  Net 1701.67 ml   Filed Weights   09/08/15 1835  Weight: 63.504 kg (140 lb)    Exam:   General:  Sedated, can barely open eyes to voice and sternal rub  Cardiovascular: RRR  Respiratory: CTA B  Abdomen: S/NT/ND/+BS  Extremities: no C/C/E   Neurologic:  Unable to assess given current mental state.  Data Reviewed: Basic Metabolic Panel:  Recent Labs Lab 09/08/15 2000 09/09/15 0635 09/10/15 0605  NA 132* 138 136  K 4.1 3.6 3.4*  CL 99* 109 109  CO2 25 22 21*  GLUCOSE 113* 120* 121*  BUN 10 8 6   CREATININE 0.91 0.80 0.79  CALCIUM 9.0 7.9* 8.2*   Liver Function Tests: No results for input(s): AST, ALT, ALKPHOS, BILITOT, PROT,  ALBUMIN in the last 168 hours. No results for input(s): LIPASE, AMYLASE in the last 168 hours. No results for input(s): AMMONIA in the last 168 hours. CBC:  Recent Labs Lab 09/08/15 2000 09/09/15 0635 09/10/15 0605  WBC 20.9* 19.4* 20.8*  NEUTROABS  --  15.0*  --   HGB 10.9* 8.4* 8.4*  HCT 32.7* 26.0* 26.1*  MCV 80.0 80.0 79.8  PLT 654* 575* 578*   Cardiac Enzymes: No results for input(s): CKTOTAL, CKMB, CKMBINDEX, TROPONINI in the last 168 hours. BNP (last 3 results) No results for input(s): BNP in the last 8760 hours.  ProBNP (last 3 results) No results for input(s): PROBNP in the last 8760 hours.  CBG: No results for input(s): GLUCAP in the last 168 hours.  Recent Results (from the past 240 hour(s))  Urine culture     Status: None   Collection Time: 09/08/15 10:30 PM  Result Value Ref Range Status   Specimen Description URINE, CATHETERIZED  Final   Special Requests NONE  Final   Culture   Final    NO GROWTH 1 DAY Performed at [facility]    Report Status 09/10/2015 FINAL  Final  Culture, blood (routine x 2)     Status: None (Preliminary result)   Collection Time: 09/08/15 11:46 PM  Result Value Ref Range Status   Specimen Description BLOOD RIGHT ANTECUBITAL  Final  Special Requests BOTTLES DRAWN AEROBIC AND ANAEROBIC 10CC EACH  Final   Culture NO GROWTH 1 DAY  Final   Report Status PENDING  Incomplete  Culture, blood (routine x 2)     Status: None (Preliminary result)   Collection Time: 09/09/15 12:01 AM  Result Value Ref Range Status   Specimen Description BLOOD RIGHT HAND  Final   Special Requests BOTTLES DRAWN AEROBIC AND ANAEROBIC 6CC EACH  Final   Culture NO GROWTH 1 DAY  Final   Report Status PENDING  Incomplete  Culture, sputum-assessment     Status: None   Collection Time: 09/09/15  4:15 AM  Result Value Ref Range Status   Specimen Description SPUTUM EXPECTORATED  Final   Special Requests NONE  Final   Sputum evaluation   Final     MICROSCOPIC FINDINGS SUGGEST THAT THIS SPECIMEN IS NOT REPRESENTATIVE OF LOWER RESPIRATORY SECRETIONS. PLEASE RECOLLECT. Results Called to: COFFEE,J. AT 1210 ON 09/09/2015 BY BAUGHAM,M. Performed at [facility]    Report Status 09/09/2015 FINAL  Final  AFB culture with smear     Status: None (Preliminary result)   Collection Time: 09/09/15  4:15 AM  Result Value Ref Range Status   Specimen Description SPUTUM EXPECTORATED  Final   Special Requests NONE  Final   Acid Fast Smear   Final    NO ACID FAST BACILLI SEEN Performed at [facility]    Culture   Final    CULTURE WILL BE EXAMINED FOR 6 WEEKS BEFORE ISSUING A FINAL REPORT Performed at [facility]    Report Status PENDING  Incomplete  C difficile quick scan w PCR reflex     Status: None   Collection Time: 09/09/15  7:30 AM  Result Value Ref Range Status   C Diff antigen NEGATIVE NEGATIVE Final   C Diff toxin NEGATIVE NEGATIVE Final   C Diff interpretation Negative for toxigenic C. difficile  Final  Stool culture     Status: None (Preliminary result)   Collection Time: 09/09/15  7:30 AM  Result Value Ref Range Status   Specimen Description STOOL  Final   Special Requests NONE  Final   Culture   Final    Culture reincubated for better growth Performed at [facility]    Report Status PENDING  Incomplete  Culture, expectorated sputum-assessment     Status: None   Collection Time: 09/10/15  8:30 AM  Result Value Ref Range Status   Specimen Description SPUTUM EXPECTORATED  Final   Special Requests NONE  Final   Sputum evaluation   Final    THIS SPECIMEN IS ACCEPTABLE. RESPIRATORY CULTURE REPORT TO FOLLOW. Performed at [facility]    Report Status 09/10/2015 FINAL  Final     Studies: Dg Chest 2 View  09/09/2015  CLINICAL DATA:  Fevers EXAM: CHEST - 2 VIEW COMPARISON:  None. FINDINGS: Cardiac shadow is within normal limits. There is a rounded area of increased density identified in  the superior segment of the left lower lung lobe emanating from the left hilar region. No other focal abnormality is noted within the lungs. Bony structures are within normal limits. IMPRESSION: Changes most consistent with a round pneumonia in the left lower lobe. Followup PA and lateral chest X-ray is recommended in 3-4 weeks following trial of antibiotic therapy to ensure resolution and exclude underlying malignancy. Electronically Signed   By: Alcide CleverMark  Lukens M.D.   On: 09/09/2015 00:47   Ct Chest Wo Contrast  09/09/2015  CLINICAL DATA:  49 year old female with cough for several months and fever. Ex-smoker. EXAM: CT CHEST WITHOUT CONTRAST TECHNIQUE: Multidetector CT imaging of the chest was performed following the standard protocol without IV contrast. COMPARISON:  06/16/2014 chest radiograph and 06/13/2014 chest CT FINDINGS: Mediastinum/Nodes: Enlarged left hilar and mediastinal lymph nodes are identified with the following index nodes: A 1.1 cm right peritracheal node (image 18) A 2 cm sub carinal node (image 24) An approximately 2.5 cm left hilar node/mass (image 26). There is no evidence of pericardial effusion. The heart and great vessels are within normal limits. Lungs/Pleura: A 3.5 x 4.3 cm mass like opacity within the superior segment left lower lobe with adjacent nodular and ground-glass opacities noted. This is suspicious for malignancy given the new enlarged left hilar and mediastinal lymph nodes. Adjacent nodular and ground-glass opacities within the left lower lobe may represent adjacent tumor or pneumonitis/pneumonia. A new 3 mm left upper lobe nodule (image 13) is noted. There is no evidence of pleural effusion or pneumothorax. Upper abdomen: Patient is status post cholecystectomy. No other significant abnormalities identified. Musculoskeletal: No acute or suspicious abnormalities are identified. IMPRESSION: 3.5 x 4.3 cm masslike opacity within the superior segment left lower lobe with enlarged  mediastinal and left hilar lymph nodes - suspicious for malignancy and lymphatic spread. Adjacent nodular and ground-glass opacities may represent neoplastic spread versus pneumonitis/pneumonia. New nonspecific 3 mm left upper lobe nodule. Electronically Signed   By: Harmon Pier M.D.   On: 09/09/2015 11:22   Ct Abdomen Pelvis W Contrast  09/08/2015  CLINICAL DATA:  Diarrhea and fecal incontinence.  Low-grade fevers. EXAM: CT ABDOMEN AND PELVIS WITH CONTRAST TECHNIQUE: Multidetector CT imaging of the abdomen and pelvis was performed using the standard protocol following bolus administration of intravenous contrast. CONTRAST:  25mL OMNIPAQUE IOHEXOL 300 MG/ML SOLN, OMNIPAQUE IOHEXOL 300 MG/ML SOLN COMPARISON:  06/13/2014 FINDINGS: Lower chest: Mild nodular opacities in the left lower lobe may be infectious or inflammatory. No confluent consolidation in the lung bases. No effusions. Hepatobiliary: Cholecystectomy.  Unremarkable liver and bile ducts. Pancreas: Normal Spleen: Normal Adrenals/Urinary Tract: The adrenals and kidneys are normal in appearance. There is no urinary calculus evident. There is no hydronephrosis or ureteral dilatation. Collecting systems and ureters appear unremarkable. Stomach/Bowel: The stomach and small bowel appear unremarkable. The appendix is normal. There is fluid content in the colon consistent with the history of diarrhea. No acute inflammatory changes are evident. There is no bowel obstruction. There is a moderately complex soft tissue abnormality at the posterior aspect of the rectum extending toward the perineum. This might represent perianal fistulae but it is not well characterized. There also is a bony defect of the lower sacrum, containing fat, consistent with a congenital dysraphic defect but relationship of this sacral anomaly to the nearby perirectal soft tissue abnormality is not conclusive. Vascular/Lymphatic: The abdominal aorta is normal in caliber. There is no  atherosclerotic calcification. There is no adenopathy in the abdomen or pelvis. Reproductive: Normal uterus and ovaries. Other: No ascites. Musculoskeletal: Sacral anomaly as described above. No other significant musculoskeletal lesions. Incidentally noted small fat containing umbilical hernia. IMPRESSION: 1. Mild nodularity in the left lower lobe lung base, new from 06/13/2014. This may be infectious or inflammatory. 2. Moderately prominent perirectal soft tissue nodularity and stranding, appearing chronic. This does not have the appearance of an acute inflammatory process. This could represent perianal fistulae - recommend correlation with physical examination. 3. Lower sacral anomaly containing fat, in  close proximity to the perirectal soft tissue abnormalities. 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 Electronically Signed   By: Ellery Plunk M.D.   On: 09/08/2015 22:33    Scheduled Meds: . cefTAZidime (FORTAZ)  IV  1 g Intravenous 3 times per day  . guaiFENesin  1,200 mg Oral BID  . lidocaine      . meperidine      . midazolam      . ondansetron  4 mg Oral TID WC & HS  . pantoprazole  40 mg Oral QAC breakfast  . polyethylene glycol-electrolytes  2,000 mL Oral Once  . promethazine      . sodium chloride      . vancomycin  750 mg Intravenous Q12H   Continuous Infusions: . sodium chloride 100 mL/hr at 09/10/15 0057    Principal Problem:   Diarrhea Active Problems:   Dehydration   Leukocytosis   Abnormal computed tomography angiography (CTA) of abdomen and pelvis   Hypertension   Emesis   Pneumonia   Lung mass    Time spent: 25 minutes. Greater than 50% of this time was spent in direct contact with the patient coordinating care.    Jamie Flowers  [facility] Pager [phone]  If 7PM-7AM, please contact night-coverage at [email or web address], password  [facility] 09/10/2015, 5:53 PM  LOS: 1 day

## 2015-09-10 NOTE — Op Note (Signed)
[facility] [address] [place], [id number]   ENDOSCOPY PROCEDURE REPORT  PATIENT: Jamie Flowers, Jamie Flowers  MR#: [id number] BIRTHDATE: 1966-04-08 , 48  yrs. old GENDER: female  ENDOSCOPIST: West BaliSandi L Dao Mearns, MD REFERRED PI:RJJOACBY:Junell [name], M.D. PROCEDURE DATE: 09/10/2015 PROCEDURE:   EGD, diagnostic  INDICATIONS:diarrhea. MEDICATIONS: TCS+ Demerol 25 mg IV and Versed 2 mg IV TOPICAL ANESTHETIC:   Xylocaine Jelly ASA CLASS:  DESCRIPTION OF PROCEDURE:     Physical exam was performed.  Informed consent was obtained from the patient after explaining the benefits, risks, and alternatives to the procedure.  The patient was connected to the monitor and placed in the left lateral position.  Continuous oxygen was provided by nasal cannula and IV medicine administered through an indwelling cannula.  After administration of sedation, the patients esophagus was intubated and the EC-[id number] ([id number]  endoscope was advanced under direct visualization to the second portion of the duodenum.  The scope was removed slowly by carefully examining the color, texture, anatomy, and integrity of the mucosa on the way out.  The patient was recovered in endoscopy and discharged home in satisfactory condition.  Estimated blood loss is zero unless otherwise noted in this procedure report.    ESOPHAGUS: The mucosa of the esophagus appeared normal.   STOMACH: Mild non-erosive gastritis (inflammation) was found in the gastric antrum.   DUODENUM: The duodenal mucosa showed no abnormalities in the bulb and 2nd part of the duodenum. COMPLICATIONS: There were no immediate complications.  ENDOSCOPIC IMPRESSION: 1.   NO SOURCE FOR DIARRHEA IDENTIFIED 2.   MILD Non-erosive gastritis  RECOMMENDATIONS: AWAIT BIOPSY & TTG IgA. LACTOSE FREE/LOW FAT DIET BENTYL QAC TID OPV W/ DR/ Lynnelle Mesmer IN 2 MOS  REPEAT EXAM:  eSigned:  West BaliSandi L Alpha Mysliwiec, MD 09/10/2015 3:17 PM  CPT CODES: ICD CODES:  The ICD and  CPT codes recommended by this software are interpretations from the data that the clinical staff has captured with the software.  The verification of the translation of this report to the ICD and CPT codes and modifiers is the sole responsibility of the health care institution and practicing physician where this report was generated.  [employer] will not be held responsible for the validity of the ICD and CPT codes included on this report.  AMA assumes no liability for data contained or not contained herein. CPT is a Publishing rights managerregistered trademark of the [employer].

## 2015-09-10 NOTE — H&P (Signed)
  Primary Care Physician:  Trinna PostKOBERLEIN, [name], MD Primary Gastroenterologist:  Dr. Darrick PennaFields  Pre-Procedure History & Physical: HPI:  Jamie Flowers is a 49 y.o. female here for DIARRHEA/WEIGHT LOSS.  Past Medical History  Diagnosis Date  . Hypertension   . Family history of anesthesia complication     MY BROTHER PASSED FROM IT"  . Complication of anesthesia   . PONV (postoperative nausea and vomiting)   . GERD (gastroesophageal reflux disease)   . Leukocytosis     Past Surgical History  Procedure Laterality Date  . Rotator cuff repair    . Breast surgery    . Cholecystectomy N/A 06/16/2014    Dr. Derrell LollingIngram     Prior to Admission medications   Medication Sig Start Date End Date Taking? Authorizing Provider  amphetamine-dextroamphetamine (ADDERALL) 20 MG tablet Take 20 mg by mouth daily.   Yes Historical Provider, MD  benzonatate (TESSALON) 200 MG capsule Take 200 mg by mouth 3 (three) times daily as needed for cough.  09/01/15  Yes Historical Provider, MD  levofloxacin (LEVAQUIN) 500 MG tablet Take 500 mg by mouth daily. 9 day course completed 08/26/15  Yes Historical Provider, MD  metoprolol succinate (TOPROL-XL) 25 MG 24 hr tablet Take 25 mg by mouth daily. 09/01/15  Yes Historical Provider, MD  ondansetron (ZOFRAN-ODT) 4 MG disintegrating tablet Take 4 mg by mouth every 8 (eight) hours as needed for nausea or vomiting.  08/26/15  Yes Historical Provider, MD  PROAIR HFA 108 (90 BASE) MCG/ACT inhaler Inhale 1-2 puffs into the lungs every 6 (six) hours as needed for wheezing or shortness of breath.  09/01/15  Yes Historical Provider, MD    Allergies as of 09/08/2015 - Review Complete 09/08/2015  Allergen Reaction Noted  . Amoxicillin Rash 09/08/2015  . Rubbing alcohol [isopropyl alcohol]  06/10/2014  . Hydrocodone Nausea Only 09/08/2015  . Oxycodone Nausea Only 09/08/2015    Family History  Problem Relation Age of Onset  . Colon cancer Neg Hx   . Lung cancer Brother      Social History   Social History  . Marital Status: Divorced    Spouse Name: N/A  . Number of Children: N/A  . Years of Education: N/A   Occupational History  . Worker's comp     shoulder injury   Social History Main Topics  . Smoking status: Former Smoker -- 1.50 packs/day for 30 years    Types: Cigarettes  . Smokeless tobacco: Never Used  . Alcohol Use: No  . Drug Use: No  . Sexual Activity: Not on file   Other Topics Concern  . Not on file   Social History Narrative    Review of Systems: See HPI, otherwise negative ROS   Physical Exam: BP 118/59 mmHg  Pulse 85  Temp(Src) 99.4 F (37.4 C) (Oral)  Resp 24  Ht 5\' 1"  (1.549 m)  Wt 140 lb (63.504 kg)  BMI 26.47 kg/m2  SpO2 98% General:   Alert,  pleasant and cooperative in NAD Head:  Normocephalic and atraumatic. Neck:  Supple; Lungs:  Clear throughout to auscultation.    Heart:  Regular rate and rhythm. Abdomen:  Soft, nontender and nondistended. Normal bowel sounds, without guarding, and without rebound.   Neurologic:  Alert and  oriented x4;  grossly normal neurologically.  Impression/Plan:   Diarrhea/WEIGHT LOSS  PLAN: TCS/EGD TODAY WITH BIOPSY

## 2015-09-11 ENCOUNTER — Encounter (HOSPITAL_COMMUNITY): Payer: Self-pay | Admitting: Gastroenterology

## 2015-09-11 ENCOUNTER — Telehealth: Payer: Self-pay | Admitting: Gastroenterology

## 2015-09-11 DIAGNOSIS — R918 Other nonspecific abnormal finding of lung field: Secondary | ICD-10-CM

## 2015-09-11 LAB — TISSUE TRANSGLUTAMINASE, IGA: Tissue Transglutaminase Ab, IgA: <2 U/mL (ref 0–3)

## 2015-09-11 LAB — OVA AND PARASITE EXAMINATION: Ova and parasites: NONE SEEN

## 2015-09-11 LAB — IGA: IGA: 248 mg/dL (ref 87–352)

## 2015-09-11 MED ORDER — GUAIFENESIN ER 600 MG PO TB12
1200.0000 mg | ORAL_TABLET | Freq: Two times a day (BID) | ORAL | Status: AC
Start: 2015-09-11 — End: ?

## 2015-09-11 MED ORDER — LEVOFLOXACIN 750 MG PO TABS: 750.0000 mg | ORAL_TABLET | Freq: Every day | ORAL | Status: AC

## 2015-09-11 MED ORDER — HYDROMORPHONE HCL 1 MG/ML IJ SOLN: 1.0000 mg | INTRAMUSCULAR | Status: DC | PRN

## 2015-09-11 MED ORDER — PANTOPRAZOLE SODIUM 40 MG PO TBEC: 40.0000 mg | DELAYED_RELEASE_TABLET | Freq: Every day | ORAL | Status: AC

## 2015-09-11 NOTE — Care Management Note (Signed)
Case Management Note  Patient Details  Name: Katharine Lookina Andis MRN: [id number] Date of Birth: 1966-07-30  Subjective/Objective:                  Pt being discharged home today with self care. Pt given MATCH voucher for prescriptions, pt asked for RX to be sent to pharmacy not on approved list. Pt informed that if meds weren't sent to a pharm on the list, she wouldn't be able to use the voucher, pt still wants meds to be sent to non-approved pharmacy.   Action/Plan: No further CM needs.  Expected Discharge Date:                  Expected Discharge Plan:  Home/Self Care  In-House Referral:  Financial Counselor  Discharge planning Services  CM Consult, Medication Assistance  Post Acute Care Choice:  NA Choice offered to:  NA  DME Arranged:    DME Agency:     HH Arranged:    HH Agency:     Status of Service:  Completed, signed off  Medicare Important Message Given:    Date Medicare IM Given:    Medicare IM give by:    Date Additional Medicare IM Given:    Additional Medicare Important Message give by:     If discussed at Long Length of Stay Meetings, dates discussed:    Additional Comments:  Malcolm MetroChildress, Rosely Fernandez Demske, RN 09/11/2015, 11:23 AM

## 2015-09-11 NOTE — Telephone Encounter (Signed)
Tried to call pt. Many rings and no answer.  

## 2015-09-11 NOTE — Discharge Summary (Signed)
Physician Discharge Summary  Jamie Flowers ZOX:[id number] DOB: [birth year] DOA: 09/08/2015  PCP: [name], MD  Admit date: 09/08/2015 Discharge date: 09/11/2015  Time spent: 45 minutes  Recommendations for Outpatient Follow-up:  -Will be discharged home today. -Advised to follow up with PCP in 2 weeks. -Will need repeat CT scan of the Chest in 6 weeks to evaluate left lung mass s/p treatment for PNA. -Will follow up with Dr. Darrick Penna for discussion of biopsy results.    Discharge Diagnoses:  Principal Problem:   Diarrhea Active Problems:   Dehydration   Leukocytosis   Abnormal computed tomography angiography (CTA) of abdomen and pelvis   Hypertension   Emesis   Pneumonia   Lung mass   Discharge Condition: Stable and improved  Filed Weights   09/08/15 1835  Weight: 63.504 kg (140 lb)    History of present illness:  As per Dr. Onalee Hua 11/1: 49 yo female with multiple complaints including diarrhea for 4 months which has been nonbloody (reports has seen PCP about this but no stool studies ever done), a month ago diagnosed with pna treated with levaquin. Reports she has been running fever over 101 on a daily basis for over 2 weeks, and her PCP told her not to worry about a fever unless it was over 102, but it has never been over 102. She reports over 10 episodes of diarrhea a day, but none since arrival to ED over 6 hours ago. Reports generalized abdominal pain. Reports coughing since her pna 3 weeks ago, this did get better with the levaquin which she completed but then over a week has worsened. No chest pain or sob. She has been having urinary and bowel incontinence after the coughing. She denies significant back pain , no le weakness and no numbness anywhere. No vomiting. Denies smoking. No sick contacts. No recent travel. No rashes. Pt referred for admission for her diarrhea, fever and elevated wbc. No cxr had been done, this was requested to be done in  the ED which shows a round infiltrate vs mass in left lower lobe.  Hospital Course:   Diarrhea -EGD/Colonoscopy unable to identify source. -Biopsies performed. -Will follow up with GI for biopsy results.  Recurrent Pneumoniae -Cx data negative to date. -Will treat with levaquin for 10 days.  Lung Mass -Highly suspicious for malignancy. -Results of CT scan discussed with patient. -Plan will be to treat PNA with levaquin and repeat CT scan in 6 weeks to evaluate mass. If unchanged, will need to have biopsy performed for diagnosis.  HTN -Well controlled.  Hyponatremia -Resolved.  Tobacco Abuse -Counseled on cessation. -States she quit about 5 months ago.   Procedures: EGD: ENDOSCOPIC IMPRESSION: 1. NO SOURCE FOR DIARRHEA IDENTIFIED  2. MILD Non-erosive gastritis Colonoscopy: ENDOSCOPIC IMPRESSION: 1. NO SOURCE FOR DIARRHEA IDENTIFIED 2. MILD PROCTITIS    Consultations:  GI, Dr. Darrick Penna  Discharge Instructions  Discharge Instructions    Increase activity slowly    Complete by:  As directed             Medication List    TAKE these medications        amphetamine-dextroamphetamine 20 MG tablet  Commonly known as:  ADDERALL  Take 20 mg by mouth daily.     benzonatate 200 MG capsule  Commonly known as:  TESSALON  Take 200 mg by mouth 3 (three) times daily as needed for cough.     guaiFENesin 600 MG 12 hr tablet  Commonly known as:  MUCINEX  Take 2 tablets (1,200 mg total) by mouth 2 (two) times daily.     levofloxacin 750 MG tablet  Commonly known as:  LEVAQUIN  Take 1 tablet (750 mg total) by mouth daily.     metoprolol succinate 25 MG 24 hr tablet  Commonly known as:  TOPROL-XL  Take 25 mg by mouth daily.     ondansetron 4 MG disintegrating tablet  Commonly known as:  ZOFRAN-ODT  Take 4 mg by mouth every 8 (eight) hours as needed for nausea or vomiting.     pantoprazole 40 MG tablet  Commonly known as:  PROTONIX  Take 1 tablet (40  mg total) by mouth daily before breakfast.     PROAIR HFA 108 (90 BASE) MCG/ACT inhaler  Generic drug:  albuterol  Inhale 1-2 puffs into the lungs every 6 (six) hours as needed for wheezing or shortness of breath.       Allergies  Allergen Reactions  . Amoxicillin Rash  . Rubbing Alcohol [Isopropyl Alcohol]   . Hydrocodone Nausea Only  . Oxycodone Nausea Only       Follow-up Information    Follow up with [name], MD. Schedule an appointment as soon as possible for a visit in 2 weeks.   Specialty:  Family Medicine   Contact information:   [address] [place] [address] [phone]       Follow up with Jonette Eva, MD.   Specialty:  Gastroenterology   Why:  office will call you for follow up   Contact information:   [address] [place] [address] [phone]        The results of significant diagnostics from this hospitalization (including imaging, microbiology, ancillary and laboratory) are listed below for reference.    Significant Diagnostic Studies: Dg Chest 2 View  09/09/2015  CLINICAL DATA:  Fevers EXAM: CHEST - 2 VIEW COMPARISON:  None. FINDINGS: Cardiac shadow is within normal limits. There is a rounded area of increased density identified in the superior segment of the left lower lung lobe emanating from the left hilar region. No other focal abnormality is noted within the lungs. Bony structures are within normal limits. IMPRESSION: Changes most consistent with a round pneumonia in the left lower lobe. Followup PA and lateral chest X-ray is recommended in 3-4 weeks following trial of antibiotic therapy to ensure resolution and exclude underlying malignancy. Electronically Signed   By: Alcide Clever M.D.   On: 09/09/2015 00:47   Ct Chest Wo Contrast  09/09/2015  CLINICAL DATA:  49 year old female with cough for several months and fever. Ex-smoker. EXAM: CT CHEST WITHOUT CONTRAST TECHNIQUE: Multidetector CT  imaging of the chest was performed following the standard protocol without IV contrast. COMPARISON:  06/16/2014 chest radiograph and 06/13/2014 chest CT FINDINGS: Mediastinum/Nodes: Enlarged left hilar and mediastinal lymph nodes are identified with the following index nodes: A 1.1 cm right peritracheal node (image 18) A 2 cm sub carinal node (image 24) An approximately 2.5 cm left hilar node/mass (image 26). There is no evidence of pericardial effusion. The heart and great vessels are within normal limits. Lungs/Pleura: A 3.5 x 4.3 cm mass like opacity within the superior segment left lower lobe with adjacent nodular and ground-glass opacities noted. This is suspicious for malignancy given the new enlarged left hilar and mediastinal lymph nodes. Adjacent nodular and ground-glass opacities within the left lower lobe may represent adjacent tumor or pneumonitis/pneumonia. A new 3 mm left upper lobe  nodule (image 13) is noted. There is no evidence of pleural effusion or pneumothorax. Upper abdomen: Patient is status post cholecystectomy. No other significant abnormalities identified. Musculoskeletal: No acute or suspicious abnormalities are identified. IMPRESSION: 3.5 x 4.3 cm masslike opacity within the superior segment left lower lobe with enlarged mediastinal and left hilar lymph nodes - suspicious for malignancy and lymphatic spread. Adjacent nodular and ground-glass opacities may represent neoplastic spread versus pneumonitis/pneumonia. New nonspecific 3 mm left upper lobe nodule. Electronically Signed   By: Harmon Pier M.D.   On: 09/09/2015 11:22   Ct Abdomen Pelvis W Contrast  09/08/2015  CLINICAL DATA:  Diarrhea and fecal incontinence.  Low-grade fevers. EXAM: CT ABDOMEN AND PELVIS WITH CONTRAST TECHNIQUE: Multidetector CT imaging of the abdomen and pelvis was performed using the standard protocol following bolus administration of intravenous contrast. CONTRAST:  25mL OMNIPAQUE IOHEXOL 300 MG/ML SOLN,  OMNIPAQUE IOHEXOL 300 MG/ML SOLN COMPARISON:  06/13/2014 FINDINGS: Lower chest: Mild nodular opacities in the left lower lobe may be infectious or inflammatory. No confluent consolidation in the lung bases. No effusions. Hepatobiliary: Cholecystectomy.  Unremarkable liver and bile ducts. Pancreas: Normal Spleen: Normal Adrenals/Urinary Tract: The adrenals and kidneys are normal in appearance. There is no urinary calculus evident. There is no hydronephrosis or ureteral dilatation. Collecting systems and ureters appear unremarkable. Stomach/Bowel: The stomach and small bowel appear unremarkable. The appendix is normal. There is fluid content in the colon consistent with the history of diarrhea. No acute inflammatory changes are evident. There is no bowel obstruction. There is a moderately complex soft tissue abnormality at the posterior aspect of the rectum extending toward the perineum. This might represent perianal fistulae but it is not well characterized. There also is a bony defect of the lower sacrum, containing fat, consistent with a congenital dysraphic defect but relationship of this sacral anomaly to the nearby perirectal soft tissue abnormality is not conclusive. Vascular/Lymphatic: The abdominal aorta is normal in caliber. There is no atherosclerotic calcification. There is no adenopathy in the abdomen or pelvis. Reproductive: Normal uterus and ovaries. Other: No ascites. Musculoskeletal: Sacral anomaly as described above. No other significant musculoskeletal lesions. Incidentally noted small fat containing umbilical hernia. IMPRESSION: 1. Mild nodularity in the left lower lobe lung base, new from 06/13/2014. This may be infectious or inflammatory. 2. Moderately prominent perirectal soft tissue nodularity and stranding, appearing chronic. This does not have the appearance of an acute inflammatory process. This could represent perianal fistulae - recommend correlation with physical examination. 3. Lower  sacral anomaly containing fat, in close proximity to the perirectal soft tissue abnormalities. 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 Electronically Signed   By: Ellery Plunk M.D.   On: 09/08/2015 22:33    Microbiology: Recent Results (from the past 240 hour(s))  Urine culture     Status: None   Collection Time: 09/08/15 10:30 PM  Result Value Ref Range Status   Specimen Description URINE, CATHETERIZED  Final   Special Requests NONE  Final   Culture   Final    NO GROWTH 1 DAY Performed at [facility]    Report Status 09/10/2015 FINAL  Final  Culture, blood (routine x 2)     Status: None (Preliminary result)   Collection Time: 09/08/15 11:46 PM  Result Value Ref Range Status   Specimen Description BLOOD RIGHT ANTECUBITAL  Final   Special Requests BOTTLES DRAWN AEROBIC AND  ANAEROBIC 10CC EACH  Final   Culture NO GROWTH 2 DAYS  Final   Report Status PENDING  Incomplete  Culture, blood (routine x 2)     Status: None (Preliminary result)   Collection Time: 09/09/15 12:01 AM  Result Value Ref Range Status   Specimen Description BLOOD RIGHT HAND  Final   Special Requests BOTTLES DRAWN AEROBIC AND ANAEROBIC 6CC EACH  Final   Culture NO GROWTH 2 DAYS  Final   Report Status PENDING  Incomplete  Culture, sputum-assessment     Status: None   Collection Time: 09/09/15  4:15 AM  Result Value Ref Range Status   Specimen Description SPUTUM EXPECTORATED  Final   Special Requests NONE  Final   Sputum evaluation   Final    MICROSCOPIC FINDINGS SUGGEST THAT THIS SPECIMEN IS NOT REPRESENTATIVE OF LOWER RESPIRATORY SECRETIONS. PLEASE RECOLLECT. Results Called to: COFFEE,J. AT 1210 ON 09/09/2015 BY BAUGHAM,M. Performed at [facility]    Report Status 09/09/2015 FINAL  Final  AFB culture with smear     Status: None (Preliminary result)   Collection Time: 09/09/15   4:15 AM  Result Value Ref Range Status   Specimen Description SPUTUM EXPECTORATED  Final   Special Requests NONE  Final   Acid Fast Smear   Final    NO ACID FAST BACILLI SEEN Performed at [facility]    Culture   Final    CULTURE WILL BE EXAMINED FOR 6 WEEKS BEFORE ISSUING A FINAL REPORT Performed at [facility]    Report Status PENDING  Incomplete  C difficile quick scan w PCR reflex     Status: None   Collection Time: 09/09/15  7:30 AM  Result Value Ref Range Status   C Diff antigen NEGATIVE NEGATIVE Final   C Diff toxin NEGATIVE NEGATIVE Final   C Diff interpretation Negative for toxigenic C. difficile  Final  Stool culture     Status: None (Preliminary result)   Collection Time: 09/09/15  7:30 AM  Result Value Ref Range Status   Specimen Description STOOL  Final   Special Requests NONE  Final   Culture   Final    Culture reincubated for better growth Performed at [facility]    Report Status PENDING  Incomplete  Culture, expectorated sputum-assessment     Status: None   Collection Time: 09/10/15  8:30 AM  Result Value Ref Range Status   Specimen Description SPUTUM EXPECTORATED  Final   Special Requests NONE  Final   Sputum evaluation   Final    THIS SPECIMEN IS ACCEPTABLE. RESPIRATORY CULTURE REPORT TO FOLLOW. Performed at [facility]    Report Status 09/10/2015 FINAL  Final     Labs: Basic Metabolic Panel:  Recent Labs Lab 09/08/15 2000 09/09/15 0635 09/10/15 0605  NA 132* 138 136  K 4.1 3.6 3.4*  CL 99* 109 109  CO2 25 22 21*  GLUCOSE 113* 120* 121*  BUN 10 8 6   CREATININE 0.91 0.80 0.79  CALCIUM 9.0 7.9* 8.2*   Liver Function Tests: No results for input(s): AST, ALT, ALKPHOS, BILITOT, PROT, ALBUMIN in the last 168 hours. No results for input(s): LIPASE, AMYLASE in the last 168 hours. No results for input(s): AMMONIA in the last 168 hours. CBC:  Recent Labs Lab 09/08/15 2000 09/09/15 0635 09/10/15 0605  WBC  20.9* 19.4* 20.8*  NEUTROABS  --  15.0*  --   HGB 10.9* 8.4* 8.4*  HCT 32.7* 26.0*  26.1*  MCV 80.0 80.0 79.8  PLT 654* 575* 578*   Cardiac Enzymes: No results for input(s): CKTOTAL, CKMB, CKMBINDEX, TROPONINI in the last 168 hours. BNP: BNP (last 3 results) No results for input(s): BNP in the last 8760 hours.  ProBNP (last 3 results) No results for input(s): PROBNP in the last 8760 hours.  CBG: No results for input(s): GLUCAP in the last 168 hours.     SignedChaya Jan  [facility] Pager: [phone] 09/11/2015, 10:38 AM

## 2015-09-11 NOTE — Telephone Encounter (Signed)
Patient called today. [facility] and wants [facility] to send her a diet list to harden her stools before she is discharged. I told her that she could mention that to the discharge nurse or if one of our provider round on her to mention that. Also, told her that I would send the nurse the message that she had called. Her room number is [phone]

## 2015-09-11 NOTE — Progress Notes (Signed)
Subjective:  Patient complains of sciatica pain, left hip/leg. No appetite. Breathing stable. No abdominal pain. Couple of loose stools since colonoscopy.   Objective: Vital signs in last 24 hours: Temp:  [99.4 F (37.4 C)-100.8 F (38.2 C)] 100.2 F (37.9 C) (11/03 0525) Pulse Rate:  [80-102] 100 (11/03 0525) Resp:  [17-43] 20 (11/03 0525) BP: (71-118)/(41-74) 113/63 mmHg (11/03 0525) SpO2:  [93 %-100 %] 93 % (11/03 0525) Last BM Date: 09/10/15 General:   Alert,  pleasant and cooperative in NAD Head:  Normocephalic and atraumatic. Eyes:  Sclera clear, no icterus.   Abdomen:  Soft, nontender and nondistended.  Extremities:  Without clubbing, deformity or edema. Neurologic:  Alert and  oriented x4;  grossly normal neurologically. Skin:  Intact without significant lesions or rashes. Psych:  Alert and cooperative. flat affect.  Intake/Output from previous day: 11/02 0701 - 11/03 0700 In: 400 [I.V.:400] Out: -  Intake/Output this shift:    Lab Results: CBC  Recent Labs  09/08/15 2000 09/09/15 0635 09/10/15 0605  WBC 20.9* 19.4* 20.8*  HGB 10.9* 8.4* 8.4*  HCT 32.7* 26.0* 26.1*  MCV 80.0 80.0 79.8  PLT 654* 575* 578*   BMET  Recent Labs  09/08/15 2000 09/09/15 0635 09/10/15 0605  NA 132* 138 136  K 4.1 3.6 3.4*  CL 99* 109 109  CO2 25 22 21*  GLUCOSE 113* 120* 121*  BUN 10 8 6   CREATININE 0.91 0.80 0.79  CALCIUM 9.0 7.9* 8.2*   LFTs No results for input(s): BILITOT, BILIDIR, IBILI, ALKPHOS, AST, ALT, PROT, ALBUMIN in the last 72 hours. No results for input(s): LIPASE in the last 72 hours. PT/INR No results for input(s): LABPROT, INR in the last 72 hours.    Imaging Studies: Dg Chest 2 View  09/09/2015  CLINICAL DATA:  Fevers EXAM: CHEST - 2 VIEW COMPARISON:  None. FINDINGS: Cardiac shadow is within normal limits. There is a rounded area of increased density identified in the superior segment of the left lower lung lobe emanating from the left hilar  region. No other focal abnormality is noted within the lungs. Bony structures are within normal limits. IMPRESSION: Changes most consistent with a round pneumonia in the left lower lobe. Followup PA and lateral chest X-ray is recommended in 3-4 weeks following trial of antibiotic therapy to ensure resolution and exclude underlying malignancy. Electronically Signed   By: Alcide CleverMark  Lukens M.D.   On: 09/09/2015 00:47   Ct Chest Wo Contrast  09/09/2015  CLINICAL DATA:  49 year old female with cough for several months and fever. Ex-smoker. EXAM: CT CHEST WITHOUT CONTRAST TECHNIQUE: Multidetector CT imaging of the chest was performed following the standard protocol without IV contrast. COMPARISON:  06/16/2014 chest radiograph and 06/13/2014 chest CT FINDINGS: Mediastinum/Nodes: Enlarged left hilar and mediastinal lymph nodes are identified with the following index nodes: A 1.1 cm right peritracheal node (image 18) A 2 cm sub carinal node (image 24) An approximately 2.5 cm left hilar node/mass (image 26). There is no evidence of pericardial effusion. The heart and great vessels are within normal limits. Lungs/Pleura: A 3.5 x 4.3 cm mass like opacity within the superior segment left lower lobe with adjacent nodular and ground-glass opacities noted. This is suspicious for malignancy given the new enlarged left hilar and mediastinal lymph nodes. Adjacent nodular and ground-glass opacities within the left lower lobe may represent adjacent tumor or pneumonitis/pneumonia. A new 3 mm left upper lobe nodule (image 13) is noted. There is no evidence of pleural effusion  or pneumothorax. Upper abdomen: Patient is status post cholecystectomy. No other significant abnormalities identified. Musculoskeletal: No acute or suspicious abnormalities are identified. IMPRESSION: 3.5 x 4.3 cm masslike opacity within the superior segment left lower lobe with enlarged mediastinal and left hilar lymph nodes - suspicious for malignancy and lymphatic  spread. Adjacent nodular and ground-glass opacities may represent neoplastic spread versus pneumonitis/pneumonia. New nonspecific 3 mm left upper lobe nodule. Electronically Signed   By: Harmon Pier M.D.   On: 09/09/2015 11:22   Ct Abdomen Pelvis W Contrast  09/08/2015  CLINICAL DATA:  Diarrhea and fecal incontinence.  Low-grade fevers. EXAM: CT ABDOMEN AND PELVIS WITH CONTRAST TECHNIQUE: Multidetector CT imaging of the abdomen and pelvis was performed using the standard protocol following bolus administration of intravenous contrast. CONTRAST:  25mL OMNIPAQUE IOHEXOL 300 MG/ML SOLN, OMNIPAQUE IOHEXOL 300 MG/ML SOLN COMPARISON:  06/13/2014 FINDINGS: Lower chest: Mild nodular opacities in the left lower lobe may be infectious or inflammatory. No confluent consolidation in the lung bases. No effusions. Hepatobiliary: Cholecystectomy.  Unremarkable liver and bile ducts. Pancreas: Normal Spleen: Normal Adrenals/Urinary Tract: The adrenals and kidneys are normal in appearance. There is no urinary calculus evident. There is no hydronephrosis or ureteral dilatation. Collecting systems and ureters appear unremarkable. Stomach/Bowel: The stomach and small bowel appear unremarkable. The appendix is normal. There is fluid content in the colon consistent with the history of diarrhea. No acute inflammatory changes are evident. There is no bowel obstruction. There is a moderately complex soft tissue abnormality at the posterior aspect of the rectum extending toward the perineum. This might represent perianal fistulae but it is not well characterized. There also is a bony defect of the lower sacrum, containing fat, consistent with a congenital dysraphic defect but relationship of this sacral anomaly to the nearby perirectal soft tissue abnormality is not conclusive. Vascular/Lymphatic: The abdominal aorta is normal in caliber. There is no atherosclerotic calcification. There is no adenopathy in the abdomen or pelvis.  Reproductive: Normal uterus and ovaries. Other: No ascites. Musculoskeletal: Sacral anomaly as described above. No other significant musculoskeletal lesions. Incidentally noted small fat containing umbilical hernia. IMPRESSION: 1. Mild nodularity in the left lower lobe lung base, new from 06/13/2014. This may be infectious or inflammatory. 2. Moderately prominent perirectal soft tissue nodularity and stranding, appearing chronic. This does not have the appearance of an acute inflammatory process. This could represent perianal fistulae - recommend correlation with physical examination. 3. Lower sacral anomaly containing fat, in close proximity to the perirectal soft tissue abnormalities. 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 Electronically Signed   By: Ellery Plunk M.D.   On: 09/08/2015 22:33  [2 weeks]   Assessment: 49 year old female admitted with concern for pneumonia, fever, leukocytosis, and acute on chronic non-bloody diarrhea with fecal incontinence. Per report, diarrhea is chronic, starting after cholecystectomy. Could have history of bile salt diarrhea, +/- IBS overlay. Recent antibiotic exposure, stools negative for Cdiff. Other stool studies pending. CT abd/pelvis without acute inflammation of colon but there is soft tissue abnormality at posterior aspect of rectum, extending toward the perineum, appearing chronic Differential raised for perianal fistula. Rectal exam in ED normal per documentation. No correlating findings on colonoscopy yesterday.   N/V: query uncontrolled GERD, gastritis on EGD. Biopsy pending.   Anemia: without hematochezia but reports possible black stool (diarrhea) this morning, question if true melena. Hgb normal at 14 a year ago when  admitted, with admitting Hgb 10.9 and now a drift to 8 range likely multifactorial. Unknown hemoccult status. Iron, iron sat  low, TIBC low and ferritin normal. Ferritin could be up as APR. ?mixed IDA/anemia of chronic disease.    Leukocytosis: per attending  Abnormal CT chest: left lobe with mass-like opacity, concerning for malignancy. Per attending. Patient is not aware of results yet due to conscious sedation yesterday. Should consider diarrhea as part of paraneoplastic syndrome.   EGD yesterday showed mild nonerosive gastritis. Colonoscopy with mild proctitis. Biopsies pending.   Plan: 1. F/u pending biopsies, labs, stool studies.  2. Low residue/lactose diet.  3. Consider pulmonary and/or oncology consultation for lung mass.  4. IV Dilaudid prn. 5. Recheck CBC tomorrow. If Hgb down less than 8, consider transfusion.  Leanna Battles. Dixon Boos [facility] [phone] 11/3/20169:45 AM    LOS: 2 days

## 2015-09-13 LAB — STOOL CULTURE

## 2015-09-13 LAB — CULTURE, RESPIRATORY W GRAM STAIN

## 2015-09-13 LAB — CULTURE, RESPIRATORY: CULTURE: NORMAL

## 2015-09-14 LAB — CULTURE, BLOOD (ROUTINE X 2)
CULTURE: NO GROWTH
Culture: NO GROWTH

## 2015-09-15 MED ORDER — ELUXADOLINE 75 MG PO TABS: 1.0000 | ORAL_TABLET | Freq: Two times a day (BID) | ORAL | Status: AC

## 2015-09-15 NOTE — Telephone Encounter (Signed)
I called pt at home. She is doing OK, still has a little fever. First BM since procedures yesterday. Loose but not watery.

## 2015-09-15 NOTE — Addendum Note (Signed)
Addended by: Nira RetortSAMS, Jeziel Hoffmann W on: 09/15/2015 03:39 PM   Modules accepted: Orders

## 2015-09-15 NOTE — Telephone Encounter (Addendum)
We could trial Viberzi 75 mg once daily, increase to twice daily with food if tolerated. Provide samples for now.   Call if no BM in 4 days. DO NOT drink alcohol while taking.

## 2015-09-15 NOTE — Telephone Encounter (Signed)
I told her that if her fever get worst to go to the ER.

## 2015-09-15 NOTE — Telephone Encounter (Signed)
She is having 4-5 bowel movement a day. No abd pain with it.

## 2015-09-15 NOTE — Telephone Encounter (Signed)
How many loose stools is she having a day? Any abdominal pain with this?

## 2015-09-15 NOTE — Telephone Encounter (Signed)
Routing to Anna Sams, NP in Dr. Fields absence.  

## 2015-09-15 NOTE — Telephone Encounter (Signed)
Noted. Chronic fevers noted. This is not an acute finding. Agree with seeking medical attention if worsening.

## 2015-09-15 NOTE — Telephone Encounter (Signed)
Pt will have husband come by to get samples. She still have a fever of 101.3 and her BP is 102/63 HR 93.

## 2015-09-20 ENCOUNTER — Telehealth: Payer: Self-pay | Admitting: Gastroenterology

## 2015-09-20 NOTE — Telephone Encounter (Signed)
Please call pt. She had MILD PROCTITIS ON BIOPSIES. HER DIARRHEA MAY BE DUE TO ABX AND/OR IBS-D.  FOLLOW A LOW FAT/LACTOSE FREE DIET. CONTINUE VIBERZI FOR LOOSE/WATERY STOOLS AND ABDOMINAL PAIN. ADD A PROBIOTIC DAILY. OUTPATIENT VISIT IN JAN 2017 E30 ABDOMINAL PAIN/DIARRHEA.

## 2015-09-22 ENCOUNTER — Encounter: Payer: Self-pay | Admitting: Gastroenterology

## 2015-09-22 NOTE — Telephone Encounter (Signed)
REVIEWED-NO ADDITIONAL RECOMMENDATIONS. 

## 2015-09-22 NOTE — Telephone Encounter (Signed)
I called to give pt results. I spoke to her son, Jamie Flowers, who said [facility] She was seen by her PCP on Friday and was suspected that she might have meningitis. [facility] and [facility] and [facility]. They are doing test and will be doing a biopsy on her left lung today. She has had a blood transfusion. I told [facility] that I was just calling with a result and pt could call at her convenience, it is not urgent.

## 2015-09-22 NOTE — Telephone Encounter (Signed)
APPT MADE AND LETTER SENT  °

## 2015-09-27 IMAGING — US US ABDOMEN LIMITED
1 series · 14 of 25 positions shown · non-contrast
Comparison: None.

CLINICAL DATA: Pain.

EXAM:
US ABDOMEN LIMITED - RIGHT UPPER QUADRANT

[Series 1: us abdomen limited · 0.20mm/px · 14 of 71 slices shown]
[im 1/71]
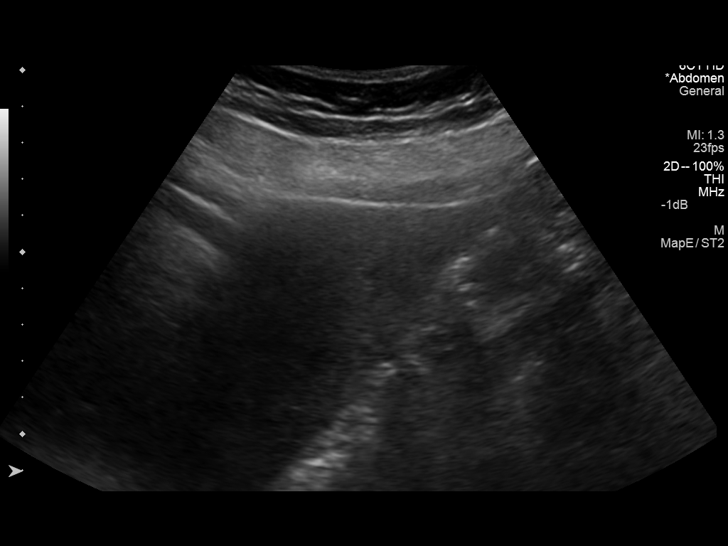
[im 6/71]
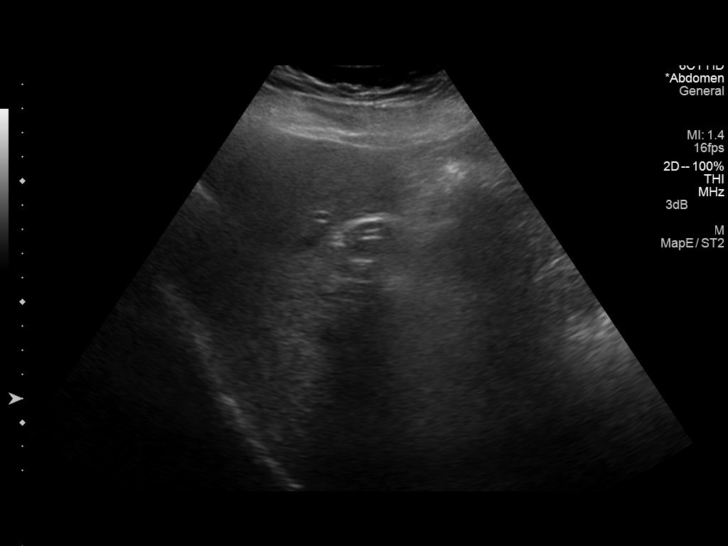
[im 12/71]
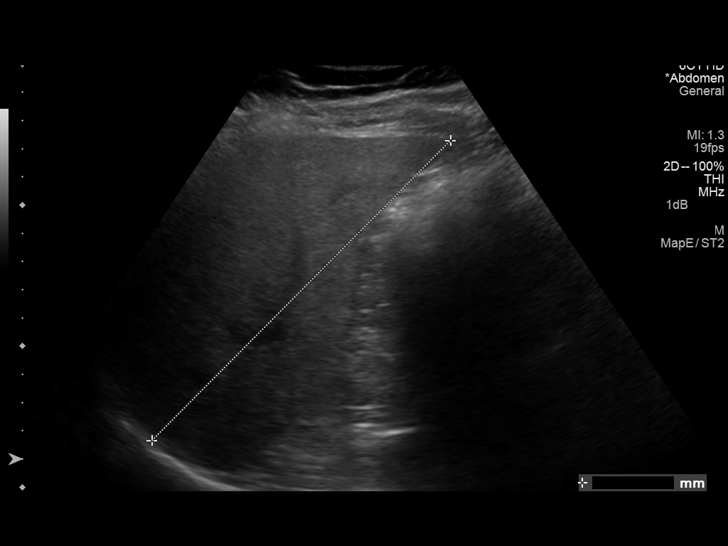
[im 18/71]
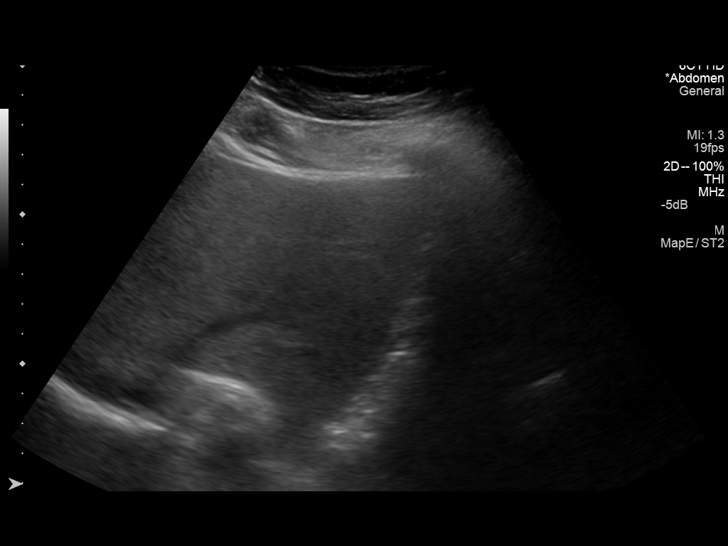
[im 24/71]
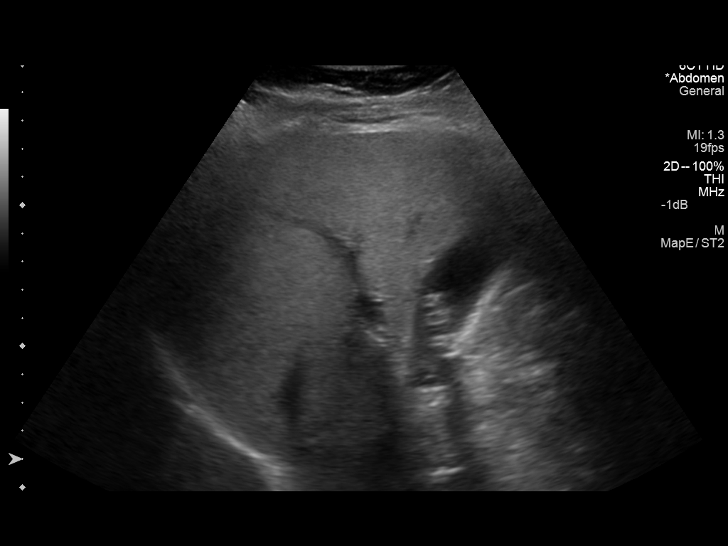
[im 27/71]
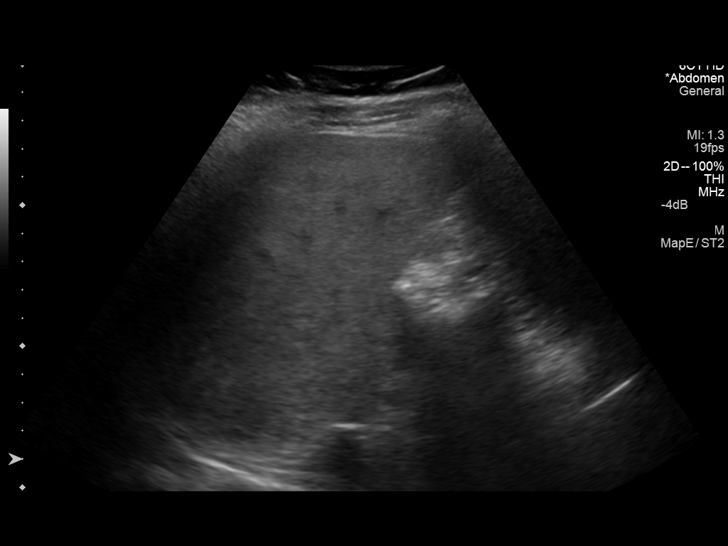
[im 33/71]
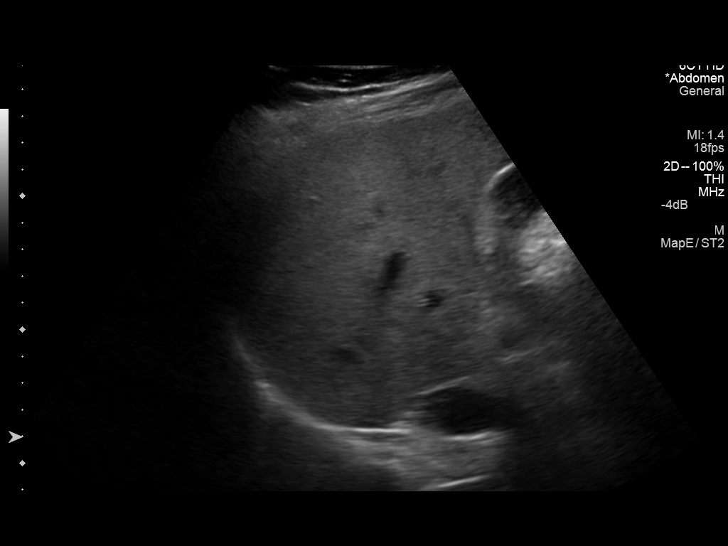
[im 38/71]
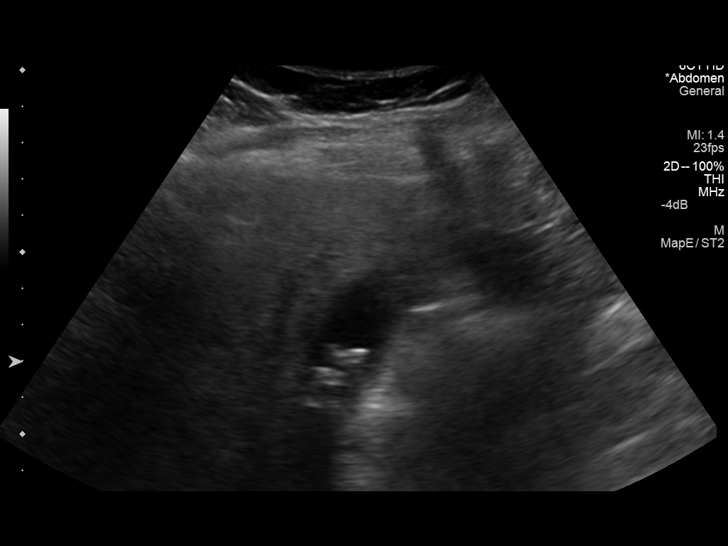
[im 44/71]
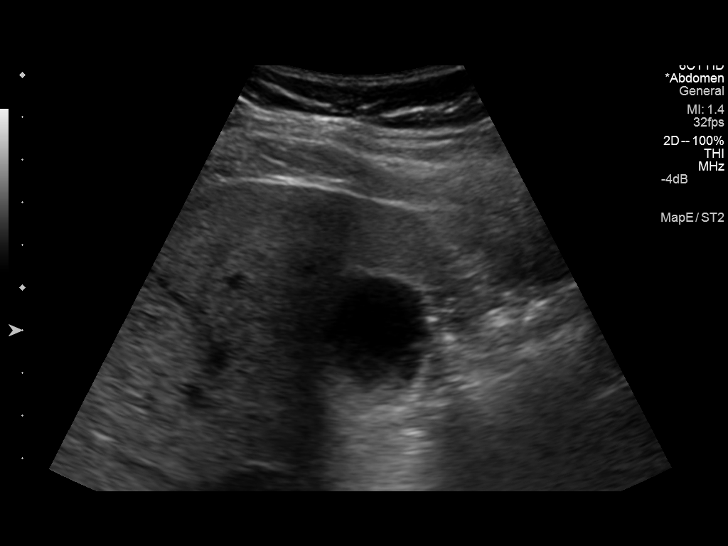
[im 47/71]
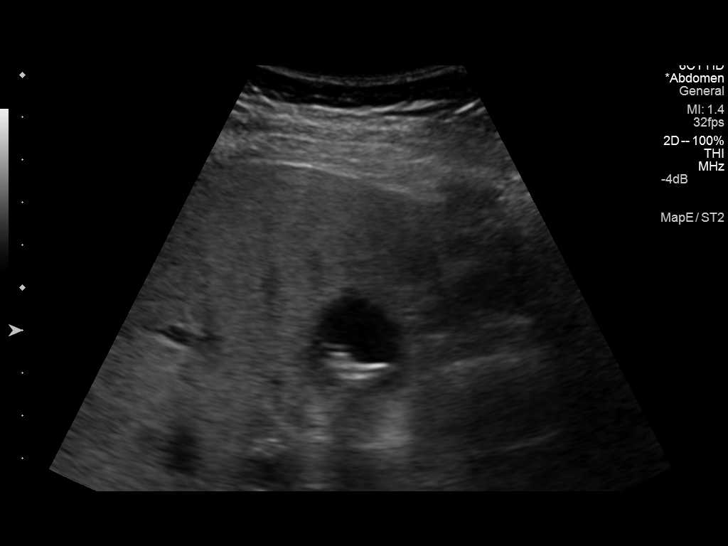
[im 53/71]
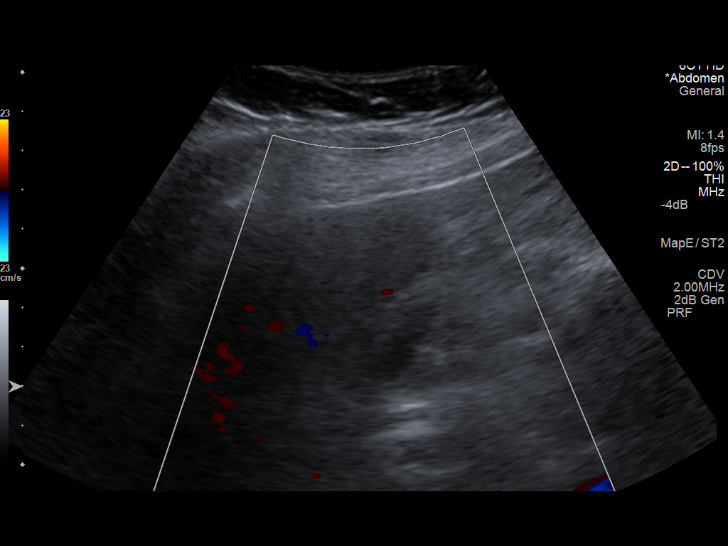
[im 59/71]
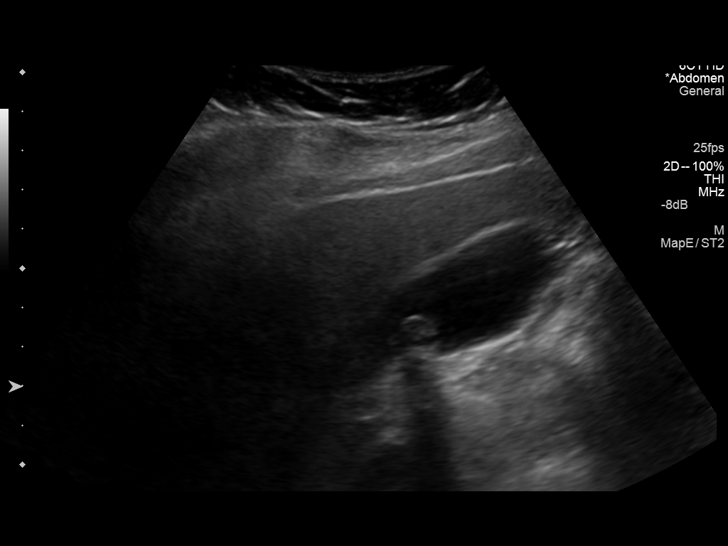
[im 65/71]
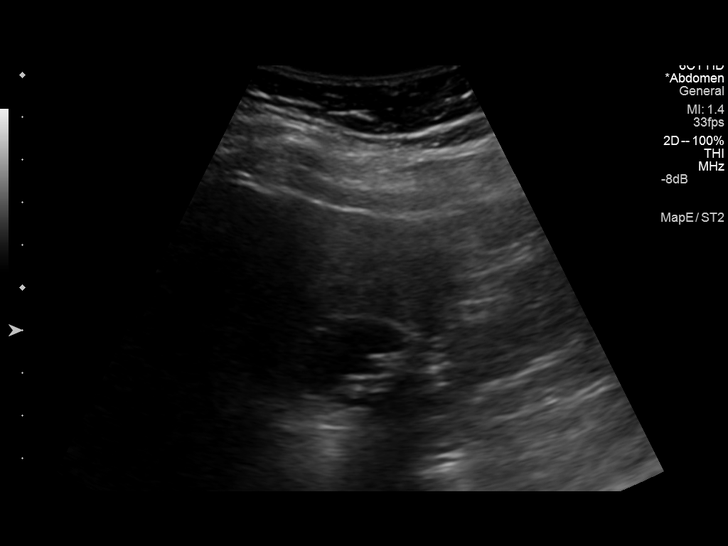
[im 71/71]
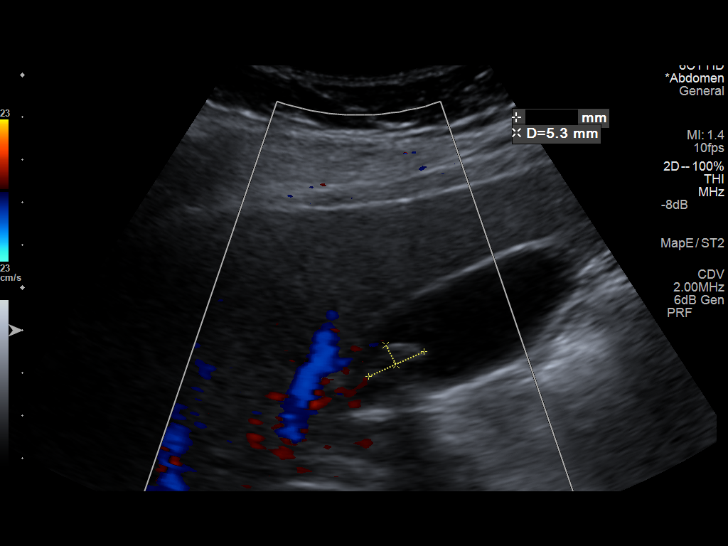

[14 of 25 positions shown; findings below may reference images not displayed]

FINDINGS: Gallbladder:

Multiple large gallstones, the largest measures 1.4 cm.

Common bile duct:

Diameter: 4.8 mm

Liver:

No focal lesion identified. Within normal limits in parenchymal
echogenicity.
IMPRESSION: Gallstones.

## 2015-10-22 LAB — AFB CULTURE WITH SMEAR (NOT AT ARMC): ACID FAST SMEAR: NONE SEEN

## 2015-11-21 ENCOUNTER — Telehealth: Payer: Self-pay | Admitting: Gastroenterology

## 2015-11-21 NOTE — Telephone Encounter (Signed)
I WAS DOING REMINDER CALLS AND WAS TOLD THAT THE PATIENT HAD RECENTLY PASSED AWAY

## 2015-11-21 NOTE — Telephone Encounter (Signed)
PLEASE NOTE IN SYSTEM. 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

## 2015-11-24 ENCOUNTER — Ambulatory Visit: Payer: Self-pay | Admitting: Gastroenterology

## 2015-12-10 DEATH — deceased

## 2016-02-25 ENCOUNTER — Encounter: Payer: Self-pay | Admitting: Gastroenterology

## 2016-12-25 IMAGING — CT CT ABD-PELV W/ CM
2 of 5 series · 15 of 46 positions shown, 17 images · IV contrast (Omnipaque 300)
Comparison: 06/13/2014

CLINICAL DATA: Diarrhea and fecal incontinence.  Low-grade fevers.

EXAM:
CT ABDOMEN AND PELVIS WITH CONTRAST
TECHNIQUE: Multidetector CT imaging of the abdomen and pelvis was performed
using the standard protocol following bolus administration of
intravenous contrast.
CONTRAST:  25mL OMNIPAQUE IOHEXOL 300 MG/ML SOLN, 100mL OMNIPAQUE
IOHEXOL 300 MG/ML SOLN

[Series 2: abd_pel_with 5.0 b40f · axial · 0.74mm/px · z∈[-438,-38]mm · 12 of 90 slices shown, 14 images]
[im 5/90  soft-tissue]
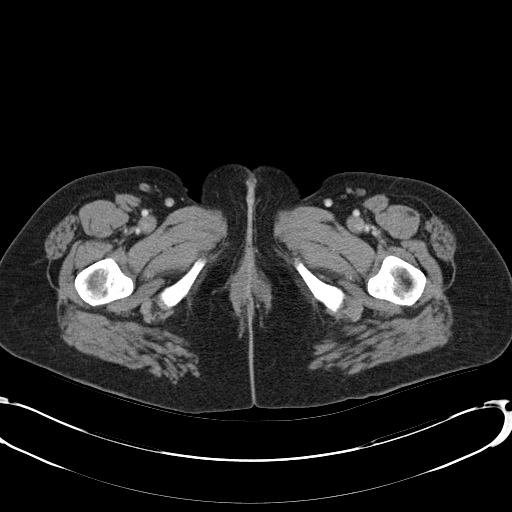
[im 5/90  bone]
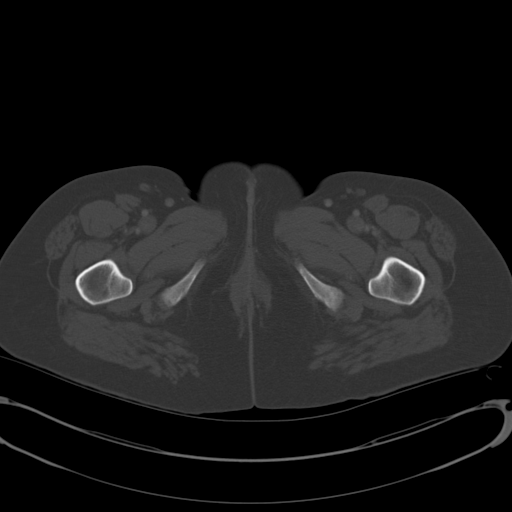
[im 15/90  soft-tissue]
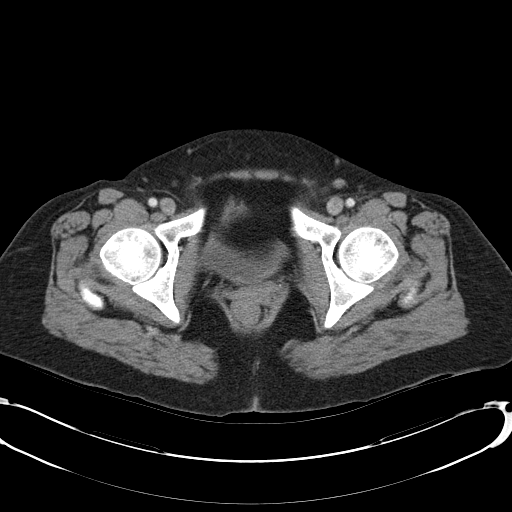
[im 20/90  soft-tissue]
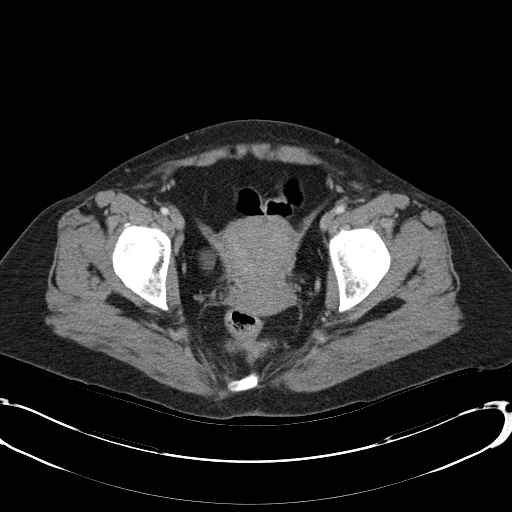
[im 25/90  soft-tissue]
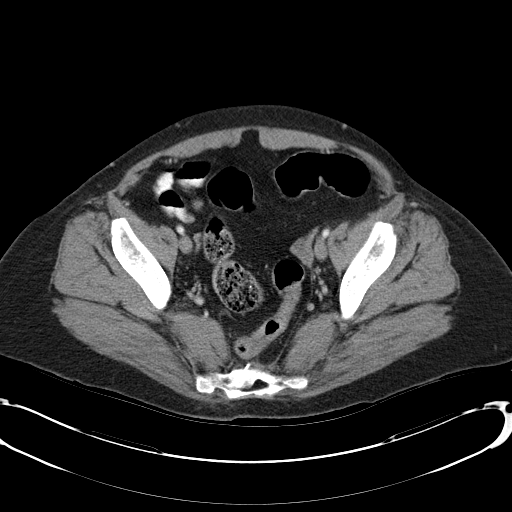
[im 35/90  soft-tissue]
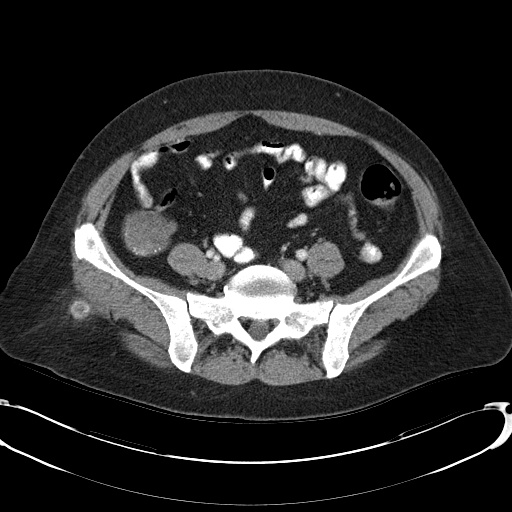
[im 40/90  soft-tissue]
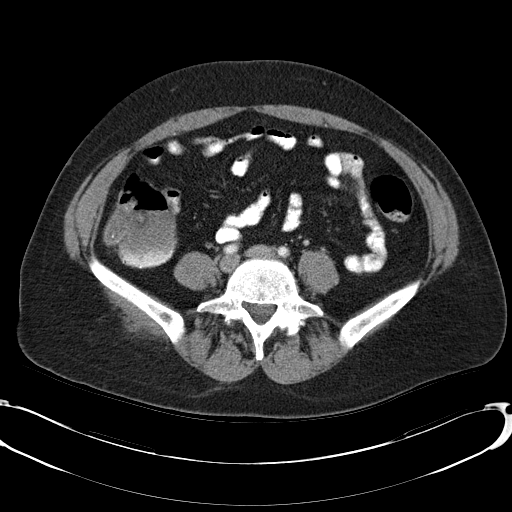
[im 50/90  soft-tissue]
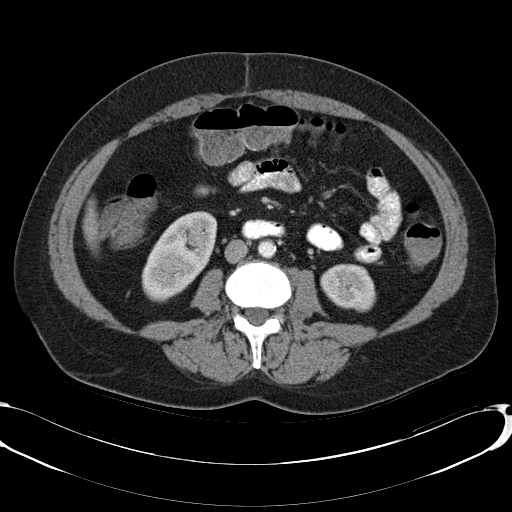
[im 55/90  soft-tissue]
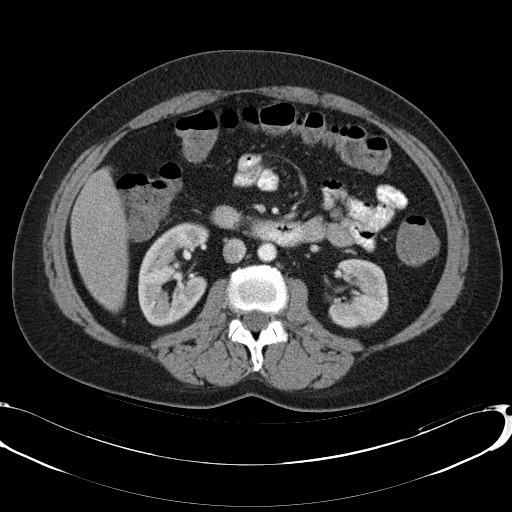
[im 65/90  soft-tissue]
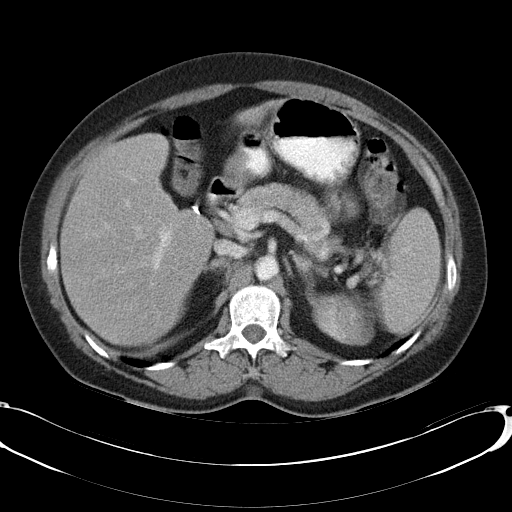
[im 65/90  bone]
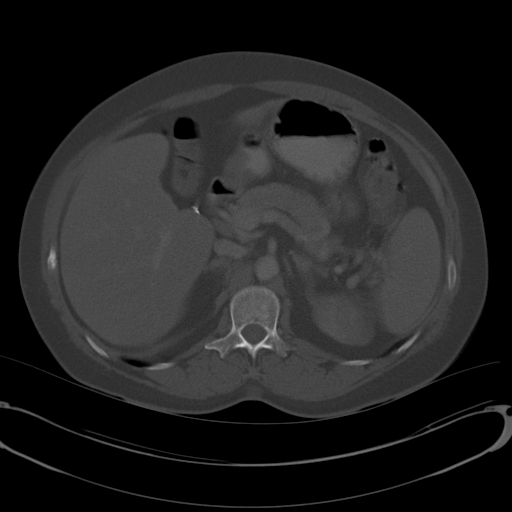
[im 70/90  soft-tissue]
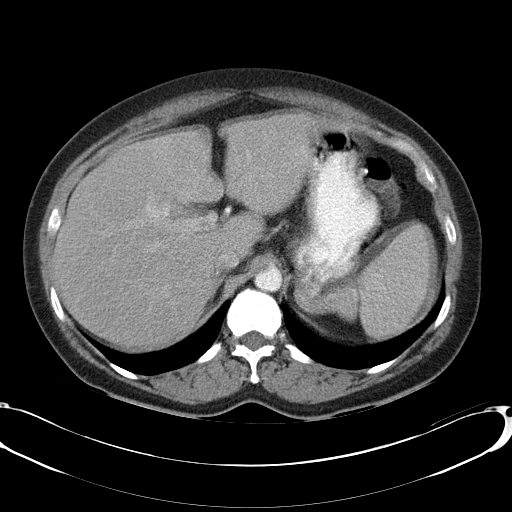
[im 75/90  soft-tissue]
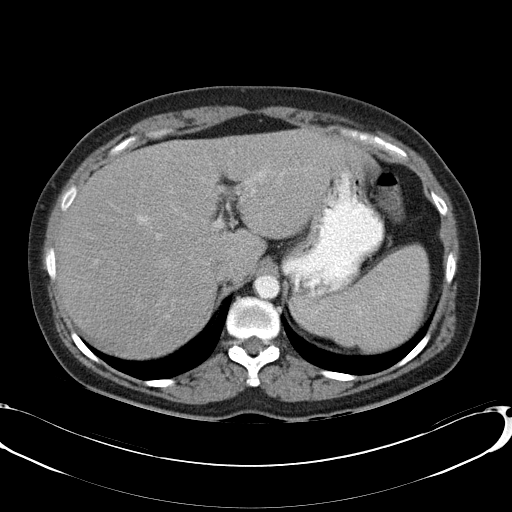
[im 85/90  soft-tissue]
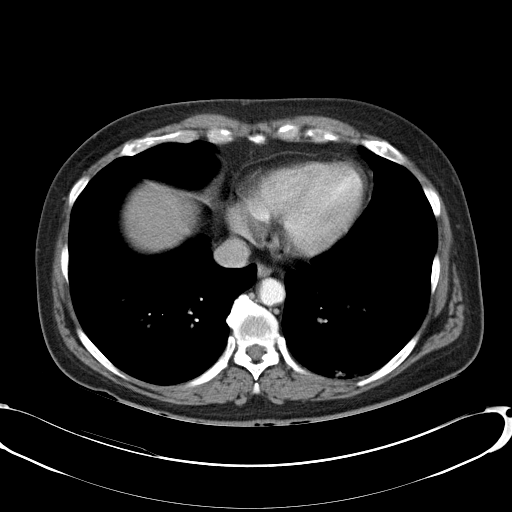

[Series 3: abd_pel_with 3.0 spo cor · coronal · 0.73mm/px · 3 of 94 slices shown]
[im 32/94  soft-tissue]
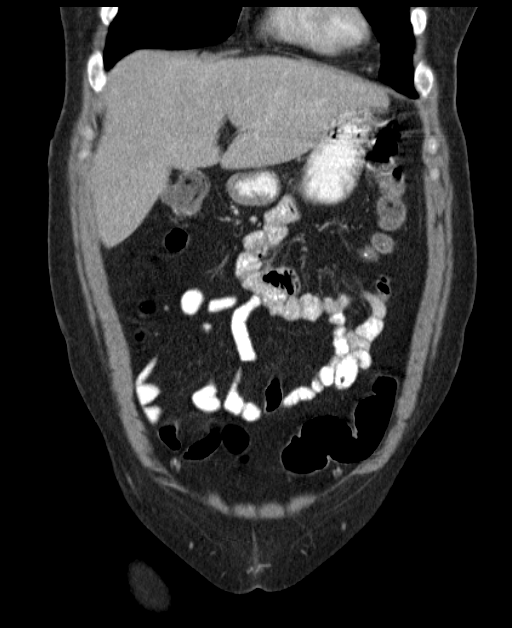
[im 42/94  soft-tissue]
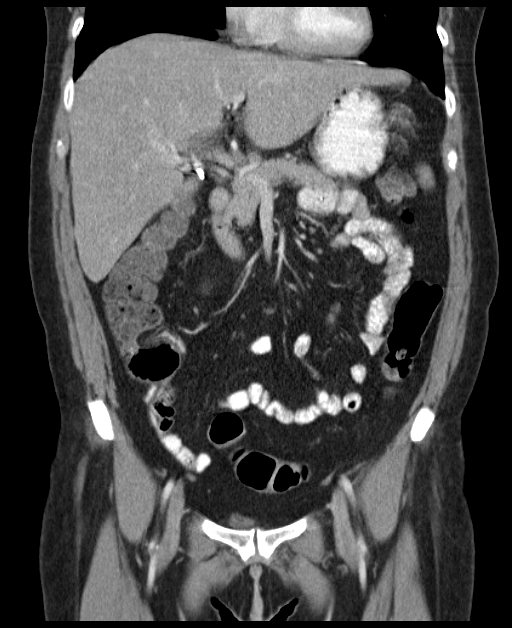
[im 52/94  soft-tissue]
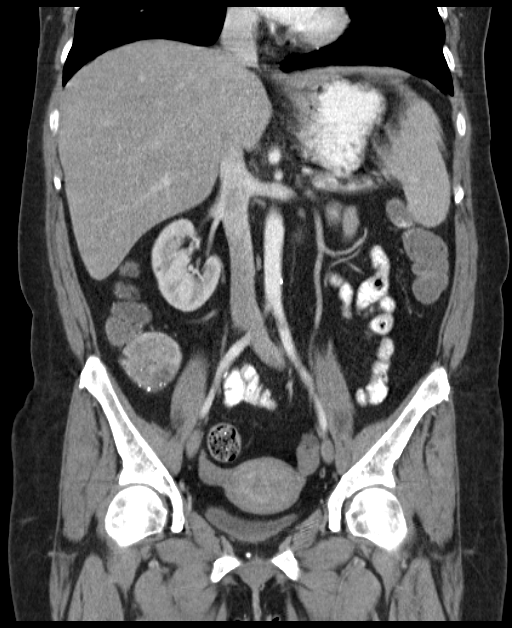

[15 of 46 positions shown; findings below may reference images not displayed]

FINDINGS: Lower chest: Mild nodular opacities in the left lower lobe may be
infectious or inflammatory. No confluent consolidation in the lung
bases. No effusions.

Hepatobiliary: Cholecystectomy.  Unremarkable liver and bile ducts.

Pancreas: Normal

Spleen: Normal

Adrenals/Urinary Tract: The adrenals and kidneys are normal in
appearance. There is no urinary calculus evident. There is no
hydronephrosis or ureteral dilatation. Collecting systems and
ureters appear unremarkable.

Stomach/Bowel: The stomach and small bowel appear unremarkable. The
appendix is normal. There is fluid content in the colon consistent
with the history of diarrhea. No acute inflammatory changes are
evident. There is no bowel obstruction. There is a moderately
complex soft tissue abnormality at the posterior aspect of the
rectum extending toward the perineum. This might represent perianal
fistulae but it is not well characterized.

There also is a bony defect of the lower sacrum, containing fat,
consistent with a congenital dysraphic defect but relationship of
this sacral anomaly to the nearby perirectal soft tissue abnormality
is not conclusive.

Vascular/Lymphatic: The abdominal aorta is normal in caliber. There
is no atherosclerotic calcification. There is no adenopathy in the
abdomen or pelvis.

Reproductive: Normal uterus and ovaries.

Other: No ascites.

Musculoskeletal: Sacral anomaly as described above. No other
significant musculoskeletal lesions. Incidentally noted small fat
containing umbilical hernia.
IMPRESSION: 1. Mild nodularity in the left lower lobe lung base, new from
06/13/2014. This may be infectious or inflammatory.
2. Moderately prominent perirectal soft tissue nodularity and
stranding, appearing chronic. This does not have the appearance of
an acute inflammatory process. This could represent perianal
fistulae - recommend correlation with physical examination.
3. Lower sacral anomaly containing fat, in close proximity to the
perirectal soft tissue abnormalities. It is possible that the
perirectal soft tissue anomalies are incidental, related to the
congenital sacral dysraphic defect rather than fistula formation.
